# Patient Record
Sex: Female | Born: 1970 | Race: White | Hispanic: No | Smoking: Current every day smoker
Health system: Southern US, Community
[De-identification: ages and names within clinical notes are randomized; demographics above are authoritative.]

## PROBLEM LIST (undated history)

## (undated) DIAGNOSIS — K449 Diaphragmatic hernia without obstruction or gangrene: Secondary | ICD-10-CM

## (undated) DIAGNOSIS — K5790 Diverticulosis of intestine, part unspecified, without perforation or abscess without bleeding: Secondary | ICD-10-CM

## (undated) DIAGNOSIS — E079 Disorder of thyroid, unspecified: Secondary | ICD-10-CM

## (undated) DIAGNOSIS — K219 Gastro-esophageal reflux disease without esophagitis: Secondary | ICD-10-CM

## (undated) DIAGNOSIS — M1990 Unspecified osteoarthritis, unspecified site: Secondary | ICD-10-CM

## (undated) DIAGNOSIS — K529 Noninfective gastroenteritis and colitis, unspecified: Secondary | ICD-10-CM

## (undated) DIAGNOSIS — F32A Depression, unspecified: Secondary | ICD-10-CM

## (undated) DIAGNOSIS — F419 Anxiety disorder, unspecified: Secondary | ICD-10-CM

## (undated) DIAGNOSIS — J45909 Unspecified asthma, uncomplicated: Secondary | ICD-10-CM

## (undated) DIAGNOSIS — G5601 Carpal tunnel syndrome, right upper limb: Secondary | ICD-10-CM

## (undated) DIAGNOSIS — I10 Essential (primary) hypertension: Secondary | ICD-10-CM

## (undated) DIAGNOSIS — T7840XA Allergy, unspecified, initial encounter: Secondary | ICD-10-CM

## (undated) DIAGNOSIS — F329 Major depressive disorder, single episode, unspecified: Secondary | ICD-10-CM

## (undated) DIAGNOSIS — D649 Anemia, unspecified: Secondary | ICD-10-CM

## (undated) HISTORY — DX: Major depressive disorder, single episode, unspecified: F32.9

## (undated) HISTORY — DX: Allergy, unspecified, initial encounter: T78.40XA

## (undated) HISTORY — DX: Disorder of thyroid, unspecified: E07.9

## (undated) HISTORY — DX: Unspecified asthma, uncomplicated: J45.909

## (undated) HISTORY — DX: Anxiety disorder, unspecified: F41.9

## (undated) HISTORY — DX: Depression, unspecified: F32.A

## (undated) HISTORY — DX: Essential (primary) hypertension: I10

---

## 1992-02-20 HISTORY — PX: WISDOM TOOTH EXTRACTION: SHX21

## 2000-06-25 ENCOUNTER — Ambulatory Visit (HOSPITAL_COMMUNITY): Admission: AD | Admit: 2000-06-25 | Discharge: 2000-06-25 | Payer: Self-pay | Admitting: *Deleted

## 2000-06-28 ENCOUNTER — Ambulatory Visit (HOSPITAL_COMMUNITY): Admission: AD | Admit: 2000-06-28 | Discharge: 2000-06-28 | Payer: Self-pay | Admitting: *Deleted

## 2000-07-10 ENCOUNTER — Inpatient Hospital Stay (HOSPITAL_COMMUNITY): Admission: AD | Admit: 2000-07-10 | Discharge: 2000-07-12 | Payer: Self-pay | Admitting: *Deleted

## 2002-01-01 ENCOUNTER — Emergency Department (HOSPITAL_COMMUNITY): Admission: EM | Admit: 2002-01-01 | Discharge: 2002-01-01 | Payer: Self-pay | Admitting: Emergency Medicine

## 2002-01-01 ENCOUNTER — Encounter: Payer: Self-pay | Admitting: Emergency Medicine

## 2002-06-25 ENCOUNTER — Ambulatory Visit (HOSPITAL_COMMUNITY): Admission: RE | Admit: 2002-06-25 | Discharge: 2002-06-25 | Payer: Self-pay | Admitting: Unknown Physician Specialty

## 2005-06-06 ENCOUNTER — Ambulatory Visit: Payer: Self-pay | Admitting: Internal Medicine

## 2005-06-27 ENCOUNTER — Ambulatory Visit: Payer: Self-pay | Admitting: Internal Medicine

## 2008-04-21 ENCOUNTER — Emergency Department (HOSPITAL_COMMUNITY): Admission: EM | Admit: 2008-04-21 | Discharge: 2008-04-21 | Payer: Self-pay | Admitting: Emergency Medicine

## 2008-04-27 ENCOUNTER — Other Ambulatory Visit: Admission: RE | Admit: 2008-04-27 | Discharge: 2008-04-27 | Payer: Self-pay | Admitting: Obstetrics and Gynecology

## 2008-04-29 ENCOUNTER — Ambulatory Visit (HOSPITAL_COMMUNITY): Admission: RE | Admit: 2008-04-29 | Discharge: 2008-04-29 | Payer: Self-pay | Admitting: Obstetrics & Gynecology

## 2010-06-01 LAB — CBC
HCT: 41.7 % (ref 36.0–46.0)
Hemoglobin: 14.1 g/dL (ref 12.0–15.0)
MCHC: 33.9 g/dL (ref 30.0–36.0)
MCV: 88.9 fL (ref 78.0–100.0)
Platelets: 343 10*3/uL (ref 150–400)
RBC: 4.69 MIL/uL (ref 3.87–5.11)
RDW: 13.8 % (ref 11.5–15.5)
WBC: 12.9 10*3/uL — ABNORMAL HIGH (ref 4.0–10.5)

## 2010-06-01 LAB — URINALYSIS, ROUTINE W REFLEX MICROSCOPIC
Protein, ur: NEGATIVE mg/dL
Urobilinogen, UA: 0.2 mg/dL (ref 0.0–1.0)

## 2010-06-01 LAB — COMPREHENSIVE METABOLIC PANEL
Albumin: 4.3 g/dL (ref 3.5–5.2)
Alkaline Phosphatase: 64 U/L (ref 39–117)
BUN: 15 mg/dL (ref 6–23)
Chloride: 103 mEq/L (ref 96–112)
Glucose, Bld: 114 mg/dL — ABNORMAL HIGH (ref 70–99)
Potassium: 4.2 mEq/L (ref 3.5–5.1)
Total Bilirubin: 0.8 mg/dL (ref 0.3–1.2)

## 2010-06-01 LAB — DIFFERENTIAL
Basophils Absolute: 0.1 10*3/uL (ref 0.0–0.1)
Basophils Relative: 1 % (ref 0–1)
Monocytes Absolute: 1.2 10*3/uL — ABNORMAL HIGH (ref 0.1–1.0)
Neutro Abs: 7.7 10*3/uL (ref 1.7–7.7)
Neutrophils Relative %: 60 % (ref 43–77)

## 2010-06-01 LAB — URINE MICROSCOPIC-ADD ON

## 2010-08-06 ENCOUNTER — Emergency Department (HOSPITAL_COMMUNITY)
Admission: EM | Admit: 2010-08-06 | Discharge: 2010-08-06 | Disposition: A | Discharge status: Home / Self Care | Payer: 59 | Attending: Emergency Medicine | Admitting: Emergency Medicine

## 2010-08-06 ENCOUNTER — Emergency Department (HOSPITAL_COMMUNITY): Payer: 59

## 2010-08-06 DIAGNOSIS — R5381 Other malaise: Secondary | ICD-10-CM | POA: Insufficient documentation

## 2010-08-06 DIAGNOSIS — R42 Dizziness and giddiness: Secondary | ICD-10-CM | POA: Insufficient documentation

## 2010-08-06 DIAGNOSIS — R51 Headache: Secondary | ICD-10-CM | POA: Insufficient documentation

## 2010-08-06 DIAGNOSIS — J189 Pneumonia, unspecified organism: Secondary | ICD-10-CM | POA: Insufficient documentation

## 2010-08-06 DIAGNOSIS — Z79899 Other long term (current) drug therapy: Secondary | ICD-10-CM | POA: Insufficient documentation

## 2010-08-06 LAB — DIFFERENTIAL
Eosinophils Relative: 0 % (ref 0–5)
Lymphocytes Relative: 20 % (ref 12–46)
Lymphs Abs: 2.7 10*3/uL (ref 0.7–4.0)
Neutro Abs: 9 10*3/uL — ABNORMAL HIGH (ref 1.7–7.7)
Neutrophils Relative %: 66 % (ref 43–77)

## 2010-08-06 LAB — BASIC METABOLIC PANEL
BUN: 6 mg/dL (ref 6–23)
CO2: 23 mEq/L (ref 19–32)
Chloride: 101 mEq/L (ref 96–112)
Creatinine, Ser: 0.82 mg/dL (ref 0.50–1.10)
Glucose, Bld: 106 mg/dL — ABNORMAL HIGH (ref 70–99)

## 2010-08-06 LAB — CBC
HCT: 44.1 % (ref 36.0–46.0)
Hemoglobin: 14.9 g/dL (ref 12.0–15.0)
MCV: 88.4 fL (ref 78.0–100.0)
RBC: 4.99 MIL/uL (ref 3.87–5.11)
WBC: 13.8 10*3/uL — ABNORMAL HIGH (ref 4.0–10.5)

## 2010-08-06 LAB — RAPID STREP SCREEN (MED CTR MEBANE ONLY): Streptococcus, Group A Screen (Direct): NEGATIVE

## 2011-11-24 ENCOUNTER — Emergency Department (HOSPITAL_COMMUNITY)
Admission: EM | Admit: 2011-11-24 | Discharge: 2011-11-24 | Disposition: A | Discharge status: Home / Self Care | Payer: 59 | Attending: Emergency Medicine | Admitting: Emergency Medicine

## 2011-11-24 ENCOUNTER — Encounter (HOSPITAL_COMMUNITY): Payer: Self-pay | Admitting: *Deleted

## 2011-11-24 DIAGNOSIS — W278XXA Contact with other nonpowered hand tool, initial encounter: Secondary | ICD-10-CM | POA: Insufficient documentation

## 2011-11-24 DIAGNOSIS — S61409A Unspecified open wound of unspecified hand, initial encounter: Secondary | ICD-10-CM | POA: Insufficient documentation

## 2011-11-24 DIAGNOSIS — S61419A Laceration without foreign body of unspecified hand, initial encounter: Secondary | ICD-10-CM

## 2011-11-24 DIAGNOSIS — Z23 Encounter for immunization: Secondary | ICD-10-CM | POA: Insufficient documentation

## 2011-11-24 HISTORY — DX: Unspecified osteoarthritis, unspecified site: M19.90

## 2011-11-24 MED ORDER — TETANUS-DIPHTH-ACELL PERTUSSIS 5-2.5-18.5 LF-MCG/0.5 IM SUSP
INTRAMUSCULAR | Status: AC
  Administered 2011-11-24: 0.5 mL via INTRAMUSCULAR
  Filled 2011-11-24: qty 0.5

## 2011-11-24 MED ORDER — TETANUS-DIPHTH-ACELL PERTUSSIS 5-2.5-18.5 LF-MCG/0.5 IM SUSP
0.5000 mL | Freq: Once | INTRAMUSCULAR | Status: AC
  Administered 2011-11-24: 0.5 mL via INTRAMUSCULAR

## 2011-11-24 MED ORDER — LIDOCAINE HCL (PF) 1 % IJ SOLN
INTRAMUSCULAR | Status: AC
  Administered 2011-11-24: 22:00:00
  Filled 2011-11-24: qty 5

## 2011-11-24 NOTE — ED Notes (Signed)
Pt presents with left palm laceration obtained on a box cutter while at home. Pt states palm of hand is tingling and is throbbing. Bleeding controlled. Wound cleaned. Grasps equal bilaterally. NAD noted.Last tetanus unknown, EDP aware.

## 2011-11-24 NOTE — ED Provider Notes (Signed)
History     CSN: [id number]  Arrival date & time 11/24/11  2120   First MD Initiated Contact with Patient 11/24/11 2150      Chief Complaint  Patient presents with  . Laceration    (Consider location/radiation/quality/duration/timing/severity/associated sxs/prior treatment) HPI Comments: Using a utility knife to cut a box.  Slipped and "stabbed myself" in the L hand.  No other injuries or complaints.  Patient is a 41 y.o. female presenting with skin laceration. The history is provided by the patient. No language interpreter was used.  Laceration  The incident occurred 1 to 2 hours ago. The laceration is located on the left hand. The laceration is 2 cm in size. The laceration mechanism was a a razor. The pain is mild. The pain has been constant since onset. She reports no foreign bodies present. Her tetanus status is out of date.    Past Medical History  Diagnosis Date  . Arthritis     History reviewed. No pertinent past surgical history.  No family history on file.  History  Substance Use Topics  . Smoking status: Current Every Day Smoker  . Smokeless tobacco: Not on file  . Alcohol Use: Yes     occ    OB History    Grav Para Term Preterm Abortions TAB SAB Ect Mult Living                  Review of Systems  Constitutional: Negative for fever and chills.  Skin: Positive for wound.  Neurological:       Tingling   All other systems reviewed and are negative.    Allergies  Sulfa antibiotics  Home Medications  No current outpatient prescriptions on file.  BP 179/98  Pulse 77  Temp 98.6 F (37 C) (Oral)  Resp 18  Ht 5\' 5"  (1.651 m)  Wt 155 lb (70.308 kg)  BMI 25.79 kg/m2  SpO2 100%  Physical Exam  Nursing note and vitals reviewed. Constitutional: She is oriented to person, place, and time. She appears well-developed and well-nourished. No distress.  HENT:  Head: Normocephalic and atraumatic.  Eyes: EOM are normal.  Neck: Normal range of motion.    Cardiovascular: Normal rate, regular rhythm and normal heart sounds.   Pulmonary/Chest: Effort normal and breath sounds normal.  Abdominal: Soft. She exhibits no distension. There is no tenderness.  Musculoskeletal: She exhibits tenderness.       Left hand: She exhibits decreased range of motion, tenderness and laceration. She exhibits no bony tenderness, normal two-point discrimination, normal capillary refill, no deformity and no swelling. normal sensation noted. Normal strength noted.       Hands: Neurological: She is alert and oriented to person, place, and time.  Skin: Skin is warm and dry.  Psychiatric: She has a normal mood and affect. Judgment normal.    ED Course  LACERATION REPAIR Date/Time: 11/24/2011 10:10 PM Performed by: Evalina Field Authorized by: Evalina Field Consent: Verbal consent obtained. Written consent not obtained. Risks and benefits: risks, benefits and alternatives were discussed Consent given by: patient Patient understanding: patient states understanding of the procedure being performed Patient consent: the patient's understanding of the procedure matches consent given Site marked: the operative site was not marked Imaging studies: imaging studies not available Patient identity confirmed: verbally with patient Time out: Immediately prior to procedure a "time out" was called to verify the correct patient, procedure, equipment, support staff and site/side marked as required. Body area: upper extremity Location details: left  hand Laceration length: 2 cm Foreign bodies: no foreign bodies Tendon involvement: none Nerve involvement: none Vascular damage: no Anesthesia: local infiltration Local anesthetic: lidocaine 1% without epinephrine Anesthetic total: 3 ml Patient sedated: no Preparation: Patient was prepped and draped in the usual sterile fashion. Irrigation solution: saline Irrigation method: syringe Amount of cleaning: standard Debridement:  none Skin closure: 4-0 nylon Technique: simple Approximation: close Approximation difficulty: simple Dressing: antibiotic ointment and 4x4 sterile gauze Patient tolerance: Patient tolerated the procedure well with no immediate complications.   (including critical care time)  Labs Reviewed - No data to display No results found.   1. Hand laceration       MDM  Wash BID Suture removal in 8-10 days.        Evalina Field, [place] 11/24/11 2228

## 2011-11-24 NOTE — ED Notes (Signed)
Pt cut left palm with a box cutter, tingling in left hand and arm

## 2011-11-25 NOTE — ED Provider Notes (Signed)
Medical screening examination/treatment/procedure(s) were performed by non-physician practitioner and as supervising physician I was immediately available for consultation/collaboration.   TRUE Garciamartinez L Stirling Orton, MD 11/25/11 0748 

## 2011-12-12 ENCOUNTER — Emergency Department (HOSPITAL_COMMUNITY): Payer: 59

## 2011-12-12 ENCOUNTER — Emergency Department (HOSPITAL_COMMUNITY)
Admission: EM | Admit: 2011-12-12 | Discharge: 2011-12-12 | Disposition: A | Discharge status: Home / Self Care | Payer: 59 | Attending: Emergency Medicine | Admitting: Emergency Medicine

## 2011-12-12 ENCOUNTER — Encounter (HOSPITAL_COMMUNITY): Payer: Self-pay | Admitting: *Deleted

## 2011-12-12 DIAGNOSIS — F17200 Nicotine dependence, unspecified, uncomplicated: Secondary | ICD-10-CM | POA: Insufficient documentation

## 2011-12-12 DIAGNOSIS — Z79899 Other long term (current) drug therapy: Secondary | ICD-10-CM | POA: Insufficient documentation

## 2011-12-12 DIAGNOSIS — R197 Diarrhea, unspecified: Secondary | ICD-10-CM | POA: Insufficient documentation

## 2011-12-12 DIAGNOSIS — Z8739 Personal history of other diseases of the musculoskeletal system and connective tissue: Secondary | ICD-10-CM | POA: Insufficient documentation

## 2011-12-12 DIAGNOSIS — K5289 Other specified noninfective gastroenteritis and colitis: Secondary | ICD-10-CM | POA: Insufficient documentation

## 2011-12-12 DIAGNOSIS — K529 Noninfective gastroenteritis and colitis, unspecified: Secondary | ICD-10-CM

## 2011-12-12 DIAGNOSIS — R112 Nausea with vomiting, unspecified: Secondary | ICD-10-CM | POA: Insufficient documentation

## 2011-12-12 LAB — COMPREHENSIVE METABOLIC PANEL
AST: 25 U/L (ref 0–37)
BUN: 8 mg/dL (ref 6–23)
CO2: 22 mEq/L (ref 19–32)
Calcium: 10 mg/dL (ref 8.4–10.5)
Creatinine, Ser: 0.79 mg/dL (ref 0.50–1.10)
GFR calc non Af Amer: >90 mL/min (ref >90)
Total Bilirubin: 0.3 mg/dL (ref 0.3–1.2)

## 2011-12-12 LAB — CBC WITH DIFFERENTIAL/PLATELET
Basophils Absolute: 0 10*3/uL (ref 0.0–0.1)
Basophils Relative: 0 % (ref 0–1)
Eosinophils Relative: 0 % (ref 0–5)
HCT: 45.1 % (ref 36.0–46.0)
Hemoglobin: 15.5 g/dL — ABNORMAL HIGH (ref 12.0–15.0)
Lymphocytes Relative: 11 % — ABNORMAL LOW (ref 12–46)
MCHC: 34.4 g/dL (ref 30.0–36.0)
MCV: 87.9 fL (ref 78.0–100.0)
Monocytes Absolute: 1.1 10*3/uL — ABNORMAL HIGH (ref 0.1–1.0)
Monocytes Relative: 6 % (ref 3–12)
RDW: 13.4 % (ref 11.5–15.5)

## 2011-12-12 LAB — URINALYSIS, ROUTINE W REFLEX MICROSCOPIC
Bilirubin Urine: NEGATIVE
Ketones, ur: NEGATIVE mg/dL
Leukocytes, UA: NEGATIVE
Nitrite: NEGATIVE
Protein, ur: NEGATIVE mg/dL
Urobilinogen, UA: 0.2 mg/dL (ref 0.0–1.0)
pH: 8.5 — ABNORMAL HIGH (ref 5.0–8.0)

## 2011-12-12 LAB — LIPASE, BLOOD: Lipase: 30 U/L (ref 11–59)

## 2011-12-12 LAB — PREGNANCY, URINE: Preg Test, Ur: NEGATIVE

## 2011-12-12 MED ORDER — SODIUM CHLORIDE 0.9 % IV SOLN
INTRAVENOUS | Status: DC
  Administered 2011-12-12: 12:00:00 via INTRAVENOUS

## 2011-12-12 MED ORDER — METRONIDAZOLE 500 MG PO TABS: 500.0000 mg | ORAL_TABLET | Freq: Two times a day (BID) | ORAL | Status: DC

## 2011-12-12 MED ORDER — METRONIDAZOLE 500 MG PO TABS
500.0000 mg | ORAL_TABLET | Freq: Once | ORAL | Status: AC
  Administered 2011-12-12: 500 mg via ORAL
  Filled 2011-12-12: qty 1

## 2011-12-12 MED ORDER — CIPROFLOXACIN HCL 250 MG PO TABS
500.0000 mg | ORAL_TABLET | Freq: Once | ORAL | Status: AC
  Administered 2011-12-12: 500 mg via ORAL
  Filled 2011-12-12: qty 2

## 2011-12-12 MED ORDER — ONDANSETRON 4 MG PO TBDP: 4.0000 mg | ORAL_TABLET | Freq: Three times a day (TID) | ORAL | Status: DC | PRN

## 2011-12-12 MED ORDER — IOHEXOL 300 MG/ML  SOLN
100.0000 mL | Freq: Once | INTRAMUSCULAR | Status: AC | PRN
  Administered 2011-12-12: 100 mL via INTRAVENOUS

## 2011-12-12 MED ORDER — OXYCODONE-ACETAMINOPHEN 5-325 MG PO TABS: ORAL_TABLET | ORAL | Status: DC

## 2011-12-12 MED ORDER — MORPHINE SULFATE 4 MG/ML IJ SOLN
4.0000 mg | INTRAMUSCULAR | Status: AC | PRN
  Administered 2011-12-12 (×2): 4 mg via INTRAVENOUS
  Filled 2011-12-12 (×2): qty 1

## 2011-12-12 MED ORDER — ONDANSETRON HCL 4 MG/2ML IJ SOLN
4.0000 mg | INTRAMUSCULAR | Status: DC | PRN
  Administered 2011-12-12: 4 mg via INTRAVENOUS
  Filled 2011-12-12: qty 2

## 2011-12-12 MED ORDER — CIPROFLOXACIN HCL 500 MG PO TABS: 500.0000 mg | ORAL_TABLET | Freq: Two times a day (BID) | ORAL | Status: DC

## 2011-12-12 MED ORDER — FAMOTIDINE IN NACL 20-0.9 MG/50ML-% IV SOLN
20.0000 mg | Freq: Once | INTRAVENOUS | Status: AC
  Administered 2011-12-12: 20 mg via INTRAVENOUS
  Filled 2011-12-12: qty 50

## 2011-12-12 NOTE — ED Notes (Signed)
NVD onset 7am, with abd pain

## 2011-12-12 NOTE — ED Provider Notes (Signed)
History     CSN: [id number]  Arrival date & time 12/12/11  1055   First MD Initiated Contact with Patient 12/12/11 1112      Chief Complaint  Patient presents with  . Emesis     HPI Pt was seen at 1130.  Per pt, c/o gradual onset and persistence of constant abd "pain" that began approx 0700 this morning PTA.  Describes the pain as starting in her upper abd, now diffuse; and radiates into her mid-back.  Has been associated with multiple intermittent episodes of N/V/D.  States she "saw blood on the toilet paper" when she wiped after having a BM.  Describes the stool as "watery."  Describes the emesis as "green."  States she had a similar episode of these symptoms several weeks ago, but it resolved after approximately 1-2 hours.  Denies pain worsens after eating certain foods.  Denies CP/SOB, no cough, no flank pain, no black or blood in stools or emesis, no fevers, no rash, no recent travel.      Past Medical History  Diagnosis Date  . Arthritis     History reviewed. No pertinent past surgical history.  History  Substance Use Topics  . Smoking status: Current Every Day Smoker  . Smokeless tobacco: Not on file  . Alcohol Use: Yes     occ      Review of Systems ROS: Statement: All systems negative except as marked or noted in the HPI; Constitutional: Negative for fever and chills. ; ; Eyes: Negative for eye pain, redness and discharge. ; ; ENMT: Negative for ear pain, hoarseness, nasal congestion, sinus pressure and sore throat. ; ; Cardiovascular: Negative for chest pain, palpitations, diaphoresis, dyspnea and peripheral edema. ; ; Respiratory: Negative for cough, wheezing and stridor. ; ; Gastrointestinal: +N/V/D, abd pain, blood on toilet paper when wiped after BM.  Negative for blood in stool, hematemesis, jaundice. . ; ; Genitourinary: Negative for dysuria, flank pain and hematuria. ; ; Musculoskeletal: Negative for back pain and neck pain. Negative for swelling and trauma.; ;  Skin: Negative for pruritus, rash, abrasions, blisters, bruising and skin lesion.; ; Neuro: Negative for headache, lightheadedness and neck stiffness. Negative for weakness, altered level of consciousness , altered mental status, extremity weakness, paresthesias, involuntary movement, seizure and syncope.       Allergies  Adhesive and Sulfa antibiotics  Home Medications   Current Outpatient Rx  Name Route Sig Dispense Refill  . MELATONIN 3 MG PO TABS Oral Take 3 mg by mouth at bedtime.    . OXYCODONE-ACETAMINOPHEN 5-325 MG PO TABS Oral Take 1 tablet by mouth every 4 (four) hours as needed. Pain      BP 138/91  Pulse 81  Temp 98.7 F (37.1 C) (Oral)  Resp 18  Ht 5\' 6"  (1.676 m)  Wt 155 lb (70.308 kg)  BMI 25.02 kg/m2  SpO2 100%  Physical Exam 1135: Physical examination:  Nursing notes reviewed; Vital signs and O2 SAT reviewed;  Constitutional: Well developed, Well nourished, Well hydrated, Uncomfortable appearing; Head:  Normocephalic, atraumatic; Eyes: EOMI, PERRL, No scleral icterus; ENMT: Mouth and pharynx normal, Mucous membranes moist; Neck: Supple, Full range of motion, No lymphadenopathy; Cardiovascular: Regular rate and rhythm, No gallop; Respiratory: Breath sounds clear & equal bilaterally, No rales, rhonchi, wheezes.  Speaking full sentences with ease, Normal respiratory effort/excursion; Chest: Nontender, Movement normal; Abdomen: Soft, +diffusely tender to palp, esp RUQ, mid-epigastric, LUQ. No rebound. Nondistended, Normal bowel sounds;; Extremities: Pulses normal, No tenderness, No  edema, No calf edema or asymmetry.; Neuro: AA&Ox3, Major CN grossly intact.  Speech clear. No gross focal motor or sensory deficits in extremities.; Skin: Color normal, Warm, Dry.   ED Course  Procedures  MDM  MDM Reviewed: nursing note and vitals Interpretation: labs, x-ray and CT scan     Results for orders placed during the hospital encounter of 12/12/11  CBC WITH DIFFERENTIAL       Component Value Range   WBC 17.4 (*) 4.0 - 10.5 K/uL   RBC 5.13 (*) 3.87 - 5.11 MIL/uL   Hemoglobin 15.5 (*) 12.0 - 15.0 g/dL   HCT 16.1  09.6 - 04.5 %   MCV 87.9  78.0 - 100.0 fL   MCH 30.2  26.0 - 34.0 pg   MCHC 34.4  30.0 - 36.0 g/dL   RDW 40.9  81.1 - 91.4 %   Platelets 368  150 - 400 K/uL   Neutrophils Relative 83 (*) 43 - 77 %   Neutro Abs 14.4 (*) 1.7 - 7.7 K/uL   Lymphocytes Relative 11 (*) 12 - 46 %   Lymphs Abs 1.8  0.7 - 4.0 K/uL   Monocytes Relative 6  3 - 12 %   Monocytes Absolute 1.1 (*) 0.1 - 1.0 K/uL   Eosinophils Relative 0  0 - 5 %   Eosinophils Absolute 0.0  0.0 - 0.7 K/uL   Basophils Relative 0  0 - 1 %   Basophils Absolute 0.0  0.0 - 0.1 K/uL  COMPREHENSIVE METABOLIC PANEL      Component Value Range   Sodium 140  135 - 145 mEq/L   Potassium 3.7  3.5 - 5.1 mEq/L   Chloride 104  96 - 112 mEq/L   CO2 22  19 - 32 mEq/L   Glucose, Bld 127 (*) 70 - 99 mg/dL   BUN 8  6 - 23 mg/dL   Creatinine, Ser 7.82  0.50 - 1.10 mg/dL   Calcium 95.6  8.4 - 21.3 mg/dL   Total Protein 7.6  6.0 - 8.3 g/dL   Albumin 4.3  3.5 - 5.2 g/dL   AST 25  0 - 37 U/L   ALT 27  0 - 35 U/L   Alkaline Phosphatase 102  39 - 117 U/L   Total Bilirubin 0.3  0.3 - 1.2 mg/dL   GFR calc non Af Amer >90  >90 mL/min   GFR calc Af Amer >90  >90 mL/min  LIPASE, BLOOD      Component Value Range   Lipase 30  11 - 59 U/L  PREGNANCY, URINE      Component Value Range   Preg Test, Ur NEGATIVE  NEGATIVE  URINALYSIS, ROUTINE W REFLEX MICROSCOPIC      Component Value Range   Color, Urine YELLOW  YELLOW   APPearance CLEAR  CLEAR   Specific Gravity, Urine 1.010  1.005 - 1.030   pH 8.5 (*) 5.0 - 8.0   Glucose, UA NEGATIVE  NEGATIVE mg/dL   Hgb urine dipstick NEGATIVE  NEGATIVE   Bilirubin Urine NEGATIVE  NEGATIVE   Ketones, ur NEGATIVE  NEGATIVE mg/dL   Protein, ur NEGATIVE  NEGATIVE mg/dL   Urobilinogen, UA 0.2  0.0 - 1.0 mg/dL   Nitrite NEGATIVE  NEGATIVE   Leukocytes, UA NEGATIVE  NEGATIVE   Dg  Chest 2 View 12/12/2011  *RADIOLOGY REPORT*  Clinical Data: Vomiting and diarrhea  CHEST - 2 VIEW  Comparison: At 08/06/2010  Findings: Heart size and vascular  pattern are normal.  The lungs are clear.  IMPRESSION: Negative   Original Report Authenticated By: Otilio Carpen, M.D.    Ct Abdomen Pelvis W Contrast 12/12/2011  *RADIOLOGY REPORT*  Clinical Data: Abdomen pain, nausea and vomiting  CT ABDOMEN AND PELVIS WITH CONTRAST  Technique:  Multidetector CT imaging of the abdomen and pelvis was performed following the standard protocol during bolus administration of intravenous contrast.  Contrast: OMNIPAQUE IOHEXOL 300 MG/ML  SOLN  Comparison: None.  Findings: Mild bilateral lower lobe atelectasis.  No acute musculoskeletal abnormalities.  Liver, gallbladder, spleen, pancreas, kidneys, and adrenal glands are normal.  Bowel gas pattern shows no evidence of obstruction. Aorta is not dilated.  Appendix is normal.  Bladder and reproductive organs are normal.  There is no free fluid.  There is a tiny calcific density adjacent to the distal left ureter.  It is not possible to spatially separate this from the ureter, but it appears likely just posterior to the ureter and furthermore there is no hydronephrosis.  These findings suggest that this is a phlebolith.  There is mild wall thickening involving the distal half of the ascending colon and the proximal half of the transverse colon.  There is also mild wall thickening involving the mid sigmoid colon.  IMPRESSION: Discontinuous areas of colon wall thickening.  This suggests the presence of colitis, which could be due to infectious causes. Inflammatory bowel disease is another consideration.   Original Report Authenticated By: Otilio Carpen, M.D.      [id number]:  Pt states she feels "better now" and wants to go home.  Offered admission. Pt declined, stating "I can feel like this at home, I don't want to be admitted."  Pt has tol PO well while in the ED.  No  vomiting or stooling while in the ED.  Remains afebrile with stable VS.  1st doses of abx given.  Dx and testing d/w pt and family.  Questions answered.  Verb understanding, agreeable to d/c home with outpt f/u.  Strict return precautions given.        Laray Anger, DO 12/14/11 1907

## 2011-12-12 NOTE — ED Notes (Signed)
Debra Barnett, in lab, reports urine pregnancy is negative.

## 2011-12-12 NOTE — ED Notes (Signed)
Pt sipping on ginger ale, tolerating well at present time,

## 2011-12-12 NOTE — ED Notes (Signed)
Pt finished drinking ct contrast, ct notified and advised pt still pending pregnancy test results, advised ct that we would call with results when they are available

## 2011-12-12 NOTE — ED Notes (Signed)
Pt c/o abd pain, n/v/d, chills, that started this am, pt states that she noticed bright red blood in her stool this am when she had the diarrhea, pt also states that she had an episode similar to this last week but today she is worse,  and she noticed bright red blood in her diarrhea then as well,

## 2011-12-21 DIAGNOSIS — K529 Noninfective gastroenteritis and colitis, unspecified: Secondary | ICD-10-CM

## 2011-12-21 HISTORY — DX: Noninfective gastroenteritis and colitis, unspecified: K52.9

## 2012-01-23 ENCOUNTER — Ambulatory Visit: Payer: 59 | Admitting: Gastroenterology

## 2012-01-27 ENCOUNTER — Emergency Department (HOSPITAL_COMMUNITY)
Admission: EM | Admit: 2012-01-27 | Discharge: 2012-01-27 | Disposition: A | Discharge status: Home / Self Care | Payer: 59 | Attending: Emergency Medicine | Admitting: Emergency Medicine

## 2012-01-27 ENCOUNTER — Emergency Department (HOSPITAL_COMMUNITY): Payer: 59

## 2012-01-27 ENCOUNTER — Encounter (HOSPITAL_COMMUNITY): Payer: Self-pay | Admitting: *Deleted

## 2012-01-27 DIAGNOSIS — R51 Headache: Secondary | ICD-10-CM | POA: Insufficient documentation

## 2012-01-27 DIAGNOSIS — F17200 Nicotine dependence, unspecified, uncomplicated: Secondary | ICD-10-CM | POA: Insufficient documentation

## 2012-01-27 DIAGNOSIS — R52 Pain, unspecified: Secondary | ICD-10-CM | POA: Insufficient documentation

## 2012-01-27 DIAGNOSIS — B349 Viral infection, unspecified: Secondary | ICD-10-CM

## 2012-01-27 DIAGNOSIS — J029 Acute pharyngitis, unspecified: Secondary | ICD-10-CM | POA: Insufficient documentation

## 2012-01-27 DIAGNOSIS — R509 Fever, unspecified: Secondary | ICD-10-CM | POA: Insufficient documentation

## 2012-01-27 DIAGNOSIS — Z8739 Personal history of other diseases of the musculoskeletal system and connective tissue: Secondary | ICD-10-CM | POA: Insufficient documentation

## 2012-01-27 DIAGNOSIS — B9789 Other viral agents as the cause of diseases classified elsewhere: Secondary | ICD-10-CM | POA: Insufficient documentation

## 2012-01-27 DIAGNOSIS — Z8719 Personal history of other diseases of the digestive system: Secondary | ICD-10-CM | POA: Insufficient documentation

## 2012-01-27 HISTORY — DX: Noninfective gastroenteritis and colitis, unspecified: K52.9

## 2012-01-27 MED ORDER — IBUPROFEN 800 MG PO TABS
800.0000 mg | ORAL_TABLET | Freq: Once | ORAL | Status: AC
  Administered 2012-01-27: 800 mg via ORAL
  Filled 2012-01-27: qty 1

## 2012-01-27 MED ORDER — HYDROCODONE-ACETAMINOPHEN 5-325 MG PO TABS
ORAL_TABLET | ORAL | Status: AC
  Administered 2012-01-27: 1 via ORAL
  Filled 2012-01-27: qty 1

## 2012-01-27 MED ORDER — GUAIFENESIN-CODEINE 100-10 MG/5ML PO SYRP: ORAL_SOLUTION | ORAL | Status: DC

## 2012-01-27 MED ORDER — HYDROCOD POLST-CHLORPHEN POLST 10-8 MG/5ML PO LQCR
5.0000 mL | Freq: Once | ORAL | Status: AC
  Administered 2012-01-27: 5 mL via ORAL
  Filled 2012-01-27: qty 5

## 2012-01-27 MED ORDER — HYDROCODONE-ACETAMINOPHEN 5-325 MG PO TABS
1.0000 | ORAL_TABLET | Freq: Once | ORAL | Status: AC
  Administered 2012-01-27: 1 via ORAL

## 2012-01-27 NOTE — ED Notes (Signed)
Patient with no complaints at this time. Respirations even and unlabored. Skin warm/dry. Discharge instructions reviewed with patient at this time. Patient given opportunity to voice concerns/ask questions. Patient discharged at this time and left Emergency Department with steady gait.   

## 2012-01-27 NOTE — ED Provider Notes (Signed)
Medical screening examination/treatment/procedure(s) were performed by non-physician practitioner and as supervising physician I was immediately available for consultation/collaboration.   Nikitta Sobiech III, MD 01/27/12 1410 

## 2012-01-27 NOTE — ED Notes (Signed)
Reports generalized body aches and cough since yesterday.  Unsure of fever.

## 2012-01-27 NOTE — ED Provider Notes (Signed)
History     CSN: [id number]  Arrival date & time 01/27/12  1134   First MD Initiated Contact with Patient 01/27/12 1300      Chief Complaint  Patient presents with  . Generalized Body Aches  . Cough    (Consider location/radiation/quality/duration/timing/severity/associated sxs/prior treatment) Patient is a 41 y.o. female presenting with cough. The history is provided by the patient. No language interpreter was used.  Cough The current episode started 2 days ago. The cough is non-productive. Associated symptoms include headaches, sore throat and myalgias. Pertinent negatives include no chills and no ear pain. Associated symptoms comments: Headache and myalgias.  Temps > 102 . Treatments tried: nyquil. The treatment provided no relief. She is not a smoker. Her past medical history does not include bronchitis, pneumonia, COPD, emphysema or asthma.    Past Medical History  Diagnosis Date  . Arthritis   . Colitis     History reviewed. No pertinent past surgical history.  No family history on file.  History  Substance Use Topics  . Smoking status: Current Every Day Smoker  . Smokeless tobacco: Not on file  . Alcohol Use: Yes     Comment: occ    OB History    Grav Para Term Preterm Abortions TAB SAB Ect Mult Living                  Review of Systems  Constitutional: Positive for fever. Negative for chills.  HENT: Positive for sore throat. Negative for ear pain.   Respiratory: Positive for cough.   Musculoskeletal: Positive for myalgias.  Neurological: Positive for headaches.  All other systems reviewed and are negative.    Allergies  Adhesive and Sulfa antibiotics  Home Medications   Current Outpatient Rx  Name  Route  Sig  Dispense  Refill  . CALCIUM CARBONATE ANTACID 500 MG PO CHEW   Oral   Chew 2-3 tablets by mouth daily as needed. For indigestion         . MELATONIN 3 MG PO TABS   Oral   Take 3 mg by mouth at bedtime.         .  GUAIFENESIN-CODEINE 100-10 MG/5ML PO SYRP      10 ml po q 4-6 hrs prn cough   240 mL   0     BP 129/81  Pulse 107  Temp 100.8 F (38.2 C) (Oral)  Resp 18  Ht 5\' 5"  (1.651 m)  Wt 150 lb (68.04 kg)  BMI 24.96 kg/m2  SpO2 99%  Physical Exam  Nursing note and vitals reviewed. Constitutional: She is oriented to person, place, and time. She appears well-developed and well-nourished. She is cooperative.  Non-toxic appearance. She does not have a sickly appearance. She appears ill. No distress.  HENT:  Head: Normocephalic and atraumatic.  Mouth/Throat: Uvula is midline, oropharynx is clear and moist and mucous membranes are normal. No uvula swelling. No oropharyngeal exudate, posterior oropharyngeal edema, posterior oropharyngeal erythema or tonsillar abscesses.  Eyes: EOM are normal.  Neck: Normal range of motion.  Cardiovascular: Normal rate and regular rhythm.   Pulmonary/Chest: Effort normal and breath sounds normal. No accessory muscle usage. Not tachypneic. No respiratory distress. She has no decreased breath sounds. She has no wheezes. She has no rhonchi. She has no rales. She exhibits no tenderness.  Abdominal: Soft. She exhibits no distension. There is no tenderness.  Musculoskeletal: Normal range of motion.  Neurological: She is alert and oriented to person, place, and time.  Skin: Skin is warm and dry.  Psychiatric: She has a normal mood and affect. Judgment normal.    ED Course  Procedures (including critical care time)   Labs Reviewed  RAPID STREP SCREEN  RAPID STREP SCREEN   Dg Chest 2 View  01/27/2012  *RADIOLOGY REPORT*  Clinical Data: Cough, fever  CHEST - 2 VIEW  Comparison: 12/12/2011  Findings: Cardiomediastinal silhouette is stable.  No acute infiltrate or pleural effusion.  No pulmonary edema.  Bony thorax is stable.  IMPRESSION: No active disease.  No significant change.   Original Report Authenticated By: Natasha Mead, M.D.      1. Viral illness        MDM  No PNA Strep screen neg Ibuprofen  rx-robitussin AC, 240 ml F/u with PCP        Evalina Field, PA 01/27/12 1338

## 2012-01-28 ENCOUNTER — Ambulatory Visit: Payer: 59 | Admitting: Gastroenterology

## 2012-03-27 ENCOUNTER — Encounter: Payer: Self-pay | Admitting: Urgent Care

## 2012-03-27 ENCOUNTER — Ambulatory Visit (INDEPENDENT_AMBULATORY_CARE_PROVIDER_SITE_OTHER): Payer: 59 | Admitting: Urgent Care

## 2012-03-27 VITALS — BP 138/90 | HR 82 | Temp 98.1°F | Ht 65.0 in | Wt 145.2 lb

## 2012-03-27 DIAGNOSIS — K5289 Other specified noninfective gastroenteritis and colitis: Secondary | ICD-10-CM

## 2012-03-27 DIAGNOSIS — R110 Nausea: Secondary | ICD-10-CM | POA: Insufficient documentation

## 2012-03-27 DIAGNOSIS — K921 Melena: Secondary | ICD-10-CM

## 2012-03-27 DIAGNOSIS — K219 Gastro-esophageal reflux disease without esophagitis: Secondary | ICD-10-CM | POA: Insufficient documentation

## 2012-03-27 DIAGNOSIS — K529 Noninfective gastroenteritis and colitis, unspecified: Secondary | ICD-10-CM

## 2012-03-27 MED ORDER — PANTOPRAZOLE SODIUM 40 MG PO TBEC: 40.0000 mg | DELAYED_RELEASE_TABLET | Freq: Every day | ORAL | Status: DC

## 2012-03-27 MED ORDER — PEG 3350-KCL-NA BICARB-NACL 420 G PO SOLR: 4000.0000 mL | ORAL | Status: DC

## 2012-03-27 NOTE — Progress Notes (Signed)
Referring Provider: Corrie Mckusick, MD Primary Care Physician:  Colette Ribas, MD Primary Gastroenterologist:  Dr. Jonette Eva  Chief Complaint  Patient presents with  . Abdominal Pain  . Medication Refill    HPI:  Debra Barnett is a 42 y.o. female here as a referral from Dr. Phillips Odor for chronic abdominal pain & nausea that started 4 months ago.  She was in her usual state of health, she ate breakfast for supper, & the next morning she felt like she was "gonna die".  She presented to the ER & had colitis on CT done at [facility] (see below).  She has been having intermittent hematochezia & saw bright red blood in her stool again yesterday.  She has had diarrhea at least 3 times over the past week.  Some days she feels constipated.  She feels like everything she eats makes her sick.  She c/o horrible acid reflux.  She prefers spicy foods, but has been avoiding them due to her symptoms.  C/o anorexia.  She has chronic nausea.  She did have sharp knife-like pain that night in ER.  She has had upper abdominal pain that is sharp & radiates to her back.  Occasionally, she belches or passes flatus & that makes the pain better.  She reports a weight loss of 10# in past 4 months.  She takes TUMS prn which provides little help.  She was previously on Protonix 40mg  in the hospital & that seemed to help.  Denies dysphagia or odynophagia.  She denies fever or chills.  She has used Marijuana a couple times per year & reports her last episode was 2 weeks ago.  She takes occasional BC powders once every 2 weeks.  Oct 2013 CT A/P with IV contrast- Discontinuous areas of colon wall thickening. This suggests the presence of colitis, which could be due to infectious causes. Inflammatory bowel disease is another consideration.  Past Medical History  Diagnosis Date  . Arthritis   . Colitis Nov 2013    No past surgical history on file.  Current Outpatient Prescriptions  Medication Sig Dispense  Refill  . calcium carbonate (TUMS - DOSED IN MG ELEMENTAL CALCIUM) 500 MG chewable tablet Chew 2-3 tablets by mouth daily as needed. For indigestion      . Melatonin 3 MG TABS Take 3 mg by mouth at bedtime.      . pantoprazole (PROTONIX) 40 MG tablet Take 1 tablet (40 mg total) by mouth daily.  30 tablet  2  . polyethylene glycol-electrolytes (TRILYTE) 420 G solution Take 4,000 mLs by mouth as directed.  4000 mL  0    Allergies as of 03/27/2012 - Review Complete 03/27/2012  Allergen Reaction Noted  . Adhesive (tape) Other (See Comments) 12/12/2011  . Sulfa antibiotics Itching 11/24/2011    Family History:There is no known family history of colorectal carcinoma , liver disease, or inflammatory bowel disease.  Problem Relation Age of Onset  . Thyroid disease Mother   . Stroke Father   . Diabetes Father     History   Social History  . Marital Status: Married    Spouse Name: N/A    Number of Children: N/A  . Years of Education: N/A   Occupational History  . Not on file.   Social History Main Topics  . Smoking status: Current Every Day Smoker -- 0.5 packs/day for 22 years    Types: Cigarettes  . Smokeless tobacco: Not on file  . Alcohol  Use: Yes     Comment: occasionally 3 times per year, HX heavy etoh x 2 yrs (20's)  . Drug Use: Yes     Comment: Past marijuana last couple weeks  . Sexually Active: Yes    Birth Control/ Protection: None   Review of Systems: Gen: see HPI CV: Denies chest pain, angina, palpitations, syncope, orthopnea, PND, peripheral edema, and claudication. Resp: Denies dyspnea at rest, dyspnea with exercise, cough, sputum, wheezing, coughing up blood, and pleurisy. GI: Denies vomiting blood, jaundice, and fecal incontinence. GU : Denies urinary burning, blood in urine, urinary frequency, urinary hesitancy, nocturnal urination, and urinary incontinence. MS: Denies joint pain, limitation of movement, and swelling, stiffness, low back pain, extremity pain.  Denies muscle weakness, cramps, atrophy.  Derm: Denies rash, itching, dry skin, hives, moles, warts, or unhealing ulcers.  Psych: Denies depression, anxiety, memory loss, suicidal ideation, hallucinations, paranoia, and confusion. Heme: Denies bruising, bleeding, and enlarged lymph nodes. Neuro:  Denies any headaches, dizziness, paresthesias. Endo:  Denies any problems with DM, thyroid, adrenal function.  Physical Exam: BP 138/90  Pulse 82  Temp 98.1 F (36.7 C) (Oral)  Ht 5\' 5"  (1.651 m)  Wt 145 lb 3.2 oz (65.862 kg)  BMI 24.16 kg/m2 No LMP recorded. Patient is postmenopausal. General:   Alert,  Well-developed, well-nourished, pleasant and cooperative in NAD.  Accompanied by her husband. Head:  Normocephalic and atraumatic. Eyes:  Sclera clear, no icterus.   Conjunctiva pink. Ears:  Normal auditory acuity. Nose:  No deformity, discharge, or lesions. Mouth:  No deformity or lesions,oropharynx pink & moist. Neck:  Supple; no masses or thyromegaly. Lungs:  Clear throughout to auscultation.   No wheezes, crackles, or rhonchi. No acute distress. Heart:  Regular rate and rhythm; no murmurs, clicks, rubs,  or gallops. Abdomen:  Normal bowel sounds.  No bruits.  Soft, non-tender and non-distended without masses, hepatosplenomegaly or hernias noted.  No guarding or rebound tenderness.   Rectal:  Deferred. Msk:  Symmetrical without gross deformities. Normal posture. Pulses:  Normal pulses noted. Extremities:  No clubbing or edema. Neurologic:  Alert and oriented x4;  grossly normal neurologically. Skin:  Intact without significant lesions or rashes. Lymph Nodes:  No significant cervical adenopathy. Psych:  Alert and cooperative. Normal mood and affect.

## 2012-03-27 NOTE — Patient Instructions (Addendum)
Begin Protonix 40mg  daily for acid reflux 1-800-QUIT-NOW for help quitting smoking Colonoscopy & EGD (Upper endoscopy) with Dr Darrick Penna Colitis Colitis is inflammation of the colon. Colitis can be a short-term or long-standing (chronic) illness. Crohn's disease and ulcerative colitis are 2 types of colitis which are chronic. They usually require lifelong treatment. CAUSES  There are many different causes of colitis, including:  Viruses.  Germs (bacteria).  Medicine reactions. SYMPTOMS   Diarrhea.  Intestinal bleeding.  Pain.  Fever.  Throwing up (vomiting).  Tiredness (fatigue).  Weight loss.  Bowel blockage. DIAGNOSIS  The diagnosis of colitis is based on examination and stool or blood tests. X-rays, CT scan, and colonoscopy may also be needed. TREATMENT  Treatment may include:  Fluids given through the vein (intravenously).  Bowel rest (nothing to eat or drink for a period of time).  Medicine for pain and diarrhea.  Medicines (antibiotics) that kill germs.  Cortisone medicines.  Surgery. HOME CARE INSTRUCTIONS   Get plenty of rest.  Drink enough water and fluids to keep your urine clear or pale yellow.  Eat a well-balanced diet.  Call your caregiver for follow-up as recommended. SEEK IMMEDIATE MEDICAL CARE IF:   You develop chills.  You have an oral temperature above 102 F (38.9 C), not controlled by medicine.  You have extreme weakness, fainting, or dehydration.  You have repeated vomiting.  You develop severe belly (abdominal) pain or are passing bloody or tarry stools. MAKE SURE YOU:   Understand these instructions.  Will watch your condition.  Will get help right away if you are not doing well or get worse. Document Released: 03/15/2004 Document Revised: 04/30/2011 Document Reviewed: 06/10/2009 [facility] Patient Information 2013 [place], [place].

## 2012-03-28 ENCOUNTER — Encounter (HOSPITAL_COMMUNITY): Payer: Self-pay | Admitting: Pharmacy Technician

## 2012-03-28 NOTE — Assessment & Plan Note (Signed)
See colitis 

## 2012-03-28 NOTE — Assessment & Plan Note (Signed)
New-onset, along with chronic nausea.  Differentials include PUD, IBD, poorly-controlled GERD, gastritis or gastroparesis.  EGD w/ Dr Darrick Penna.  Begin Protonix 40mg  daily for acid reflux

## 2012-03-28 NOTE — Assessment & Plan Note (Signed)
See GERD 

## 2012-03-28 NOTE — Assessment & Plan Note (Addendum)
Debra Barnett is a pleasant 42 y.o. female with 4 month hx of abdominal pain, nausea, & hematochezia that started with an acute episode of colitis that lead her to ER (Oct 2013).  Differentials include likely inflammatory bowel disease vs. Post-infectious IBS with benign anorectal bleeding.  She takes rare headache powders, & denies other significant NSAIDs.  Colonoscopy & EGD for further evaluation with Dr Darrick Penna.  I have discussed risks & benefits which include, but are not limited to, bleeding, infection, perforation & drug reaction.  The patient agrees with this plan & written consent will be obtained.    1-800-QUIT-NOW for help quitting smoking Colitis literature Pt declines pain meds or anti-emetics at this time

## 2012-03-31 NOTE — Progress Notes (Signed)
Faxed to PCP

## 2012-04-11 ENCOUNTER — Encounter (HOSPITAL_COMMUNITY)
Admission: RE | Disposition: A | Discharge status: Home / Self Care | Payer: Self-pay | Source: Ambulatory Visit | Attending: Gastroenterology

## 2012-04-11 ENCOUNTER — Ambulatory Visit (HOSPITAL_COMMUNITY)
Admission: RE | Admit: 2012-04-11 | Discharge: 2012-04-11 | Disposition: A | Discharge status: Home / Self Care | Payer: 59 | Source: Ambulatory Visit | Attending: Gastroenterology | Admitting: Gastroenterology

## 2012-04-11 ENCOUNTER — Encounter (HOSPITAL_COMMUNITY): Payer: Self-pay | Admitting: *Deleted

## 2012-04-11 DIAGNOSIS — R1013 Epigastric pain: Secondary | ICD-10-CM | POA: Insufficient documentation

## 2012-04-11 DIAGNOSIS — K5730 Diverticulosis of large intestine without perforation or abscess without bleeding: Secondary | ICD-10-CM

## 2012-04-11 DIAGNOSIS — K2990 Gastroduodenitis, unspecified, without bleeding: Secondary | ICD-10-CM

## 2012-04-11 DIAGNOSIS — K921 Melena: Secondary | ICD-10-CM | POA: Insufficient documentation

## 2012-04-11 DIAGNOSIS — K2970 Gastritis, unspecified, without bleeding: Secondary | ICD-10-CM

## 2012-04-11 DIAGNOSIS — K648 Other hemorrhoids: Secondary | ICD-10-CM | POA: Insufficient documentation

## 2012-04-11 DIAGNOSIS — R109 Unspecified abdominal pain: Secondary | ICD-10-CM

## 2012-04-11 DIAGNOSIS — R112 Nausea with vomiting, unspecified: Secondary | ICD-10-CM

## 2012-04-11 DIAGNOSIS — R1110 Vomiting, unspecified: Secondary | ICD-10-CM | POA: Insufficient documentation

## 2012-04-11 DIAGNOSIS — K2940 Chronic atrophic gastritis without bleeding: Secondary | ICD-10-CM | POA: Insufficient documentation

## 2012-04-11 DIAGNOSIS — K222 Esophageal obstruction: Secondary | ICD-10-CM

## 2012-04-11 HISTORY — DX: Gastro-esophageal reflux disease without esophagitis: K21.9

## 2012-04-11 HISTORY — DX: Anemia, unspecified: D64.9

## 2012-04-11 HISTORY — PX: COLONOSCOPY WITH ESOPHAGOGASTRODUODENOSCOPY (EGD): SHX5779

## 2012-04-11 HISTORY — DX: Carpal tunnel syndrome, right upper limb: G56.01

## 2012-04-11 SURGERY — COLONOSCOPY WITH ESOPHAGOGASTRODUODENOSCOPY (EGD)
Anesthesia: Moderate Sedation

## 2012-04-11 MED ORDER — MIDAZOLAM HCL 5 MG/5ML IJ SOLN
INTRAMUSCULAR | Status: AC
  Filled 2012-04-11: qty 10

## 2012-04-11 MED ORDER — MEPERIDINE HCL 100 MG/ML IJ SOLN
INTRAMUSCULAR | Status: DC | PRN
  Administered 2012-04-11: 25 mg via INTRAVENOUS
  Administered 2012-04-11: 50 mg via INTRAVENOUS
  Administered 2012-04-11: 25 mg via INTRAVENOUS

## 2012-04-11 MED ORDER — STERILE WATER FOR IRRIGATION IR SOLN
Status: DC | PRN
  Administered 2012-04-11: 09:00:00

## 2012-04-11 MED ORDER — MIDAZOLAM HCL 5 MG/5ML IJ SOLN
INTRAMUSCULAR | Status: DC | PRN
  Administered 2012-04-11 (×2): 2 mg via INTRAVENOUS
  Administered 2012-04-11 (×2): 1 mg via INTRAVENOUS

## 2012-04-11 MED ORDER — SODIUM CHLORIDE 0.45 % IV SOLN
INTRAVENOUS | Status: DC
  Administered 2012-04-11: 08:00:00 via INTRAVENOUS

## 2012-04-11 MED ORDER — BUTAMBEN-TETRACAINE-BENZOCAINE 2-2-14 % EX AERO
INHALATION_SPRAY | CUTANEOUS | Status: DC | PRN
  Administered 2012-04-11: 2 via TOPICAL

## 2012-04-11 MED ORDER — PANTOPRAZOLE SODIUM 40 MG PO TBEC: DELAYED_RELEASE_TABLET | ORAL | Status: DC

## 2012-04-11 MED ORDER — MEPERIDINE HCL 100 MG/ML IJ SOLN
INTRAMUSCULAR | Status: AC
  Filled 2012-04-11: qty 1

## 2012-04-11 NOTE — H&P (Signed)
  Primary Care Physician:  Colette Ribas, MD Primary Gastroenterologist:  Dr. Darrick Penna  Pre-Procedure History & Physical: HPI:  Debra Barnett is a 42 y.o. female here for BRBPR/GERD/VOMITING/NAUSEA.   Past Medical History  Diagnosis Date  . Arthritis   . Colitis Nov 2013  . GERD (gastroesophageal reflux disease)   . Anemia   . Carpal tunnel syndrome of right wrist     Past Surgical History  Procedure Laterality Date  . Wisdom tooth extraction  1994    Prior to Admission medications   Medication Sig Start Date End Date Taking? Authorizing Provider  calcium carbonate (TUMS - DOSED IN MG ELEMENTAL CALCIUM) 500 MG chewable tablet Chew 2-3 tablets by mouth daily as needed. For indigestion   Yes Historical Provider, MD  Melatonin 3 MG TABS Take 3 mg by mouth at bedtime.   Yes Historical Provider, MD  pantoprazole (PROTONIX) 40 MG tablet Take 1 tablet (40 mg total) by mouth daily. 03/27/12  Yes Joselyn Arrow, NP  polyethylene glycol-electrolytes (TRILYTE) 420 G solution Take 4,000 mLs by mouth as directed. 03/27/12  Yes West Bali, MD    Allergies as of 03/27/2012 - Review Complete 03/27/2012  Allergen Reaction Noted  . Adhesive (tape) Other (See Comments) 12/12/2011  . Sulfa antibiotics Itching 11/24/2011    Family History  Problem Relation Age of Onset  . Thyroid disease Mother   . Stroke Father   . Diabetes Father   . Colon cancer Neg Hx   . Colon polyps Neg Hx     History   Social History  . Marital Status: Married    Spouse Name: N/A    Number of Children: N/A  . Years of Education: N/A   Occupational History  . Not on file.   Social History Main Topics  . Smoking status: Current Every Day Smoker -- 1.00 packs/day for 22 years    Types: Cigarettes  . Smokeless tobacco: Not on file  . 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 Drug Use: No     Comment: pt reports using a friend's pain med at times for  severe pain-Past marijuana last couple weeks  . Sexually Active: Yes    Birth Control/ Protection: None, Post-menopausal   Other Topics Concern  . Not on file   Social History Narrative  . No narrative on file    Review of Systems: See HPI, otherwise negative ROS   Physical Exam: BP 151/96  Pulse 80  Temp(Src) 98.1 F (36.7 C) (Oral)  Resp 15  Ht 5\' 5"  (1.651 m)  Wt 143 lb (64.864 kg)  BMI 23.8 kg/m2  SpO2 99% General:   Alert,  pleasant and cooperative in NAD Head:  Normocephalic and atraumatic. Neck:  Supple; Lungs:  Clear throughout to auscultation.    Heart:  Regular rate and rhythm. Abdomen:  Soft, nontender and nondistended. Normal bowel sounds, without guarding, and without rebound.   Neurologic:  Alert and  oriented x4;  grossly normal neurologically.  Impression/Plan:     BRBPR/dyspepsia/vomiting  PLAN: EGD/TCS TODAY

## 2012-04-11 NOTE — Op Note (Addendum)
[facility] [address] [place], [address]   ENDOSCOPY PROCEDURE REPORT  PATIENT: Taniah, Reinecke  MR#: [id number] BIRTHDATE: 12/02/1970 , 41  yrs. old GENDER: Female  ENDOSCOPIST: Jonette Eva, MD REFERRED XB:JYNW Phillips Odor, M.D.  PROCEDURE DATE: 04/11/2012 PROCEDURE:   EGD w/ biopsy  INDICATIONS:Nausea.   Vomiting.   Epigastric pain. MEDICATIONS: TCS + Versed 1mg  IV TOPICAL ANESTHETIC:   Cetacaine Spray  DESCRIPTION OF PROCEDURE:     Physical exam was performed.  Informed consent was obtained from the patient after explaining the benefits, risks, and alternatives to the procedure.  The patient was connected to the monitor and placed in the left lateral position.  Continuous oxygen was provided by nasal cannula and IV medicine administered through an indwelling cannula.  After administration of sedation, the patients esophagus was intubated and the [id number] ([id number])  endoscope was advanced under direct visualization to the second portion of the duodenum.  The scope was removed slowly by carefully examining the color, texture, anatomy, and integrity of the mucosa on the way out.  The patient was recovered in endoscopy and discharged home in satisfactory condition.   ESOPHAGUS: A stricture was found at the gastroesophageal junction. The stenosis was traversable with the endoscope.   A small hiatal hernia was noted.   STOMACH: Mild non-erosive gastritis (inflammation) was found.  Multiple biopsies were performed. DUODENUM: The duodenal mucosa showed no abnormalities in the bulb and second portion of the duodenum.  COMPLICATIONS:   None  ENDOSCOPIC IMPRESSION: 1.   Stricture was found at the gastroesophageal junction 2.   Small hiatal hernia 3.   Non-erosive gastritis (inflammation) was found; multiple biopsies 4.   The duodenal mucosa showed no abnormalities in the bulb and second portion of the duodenum  RECOMMENDATIONS: STOP SMOKING. CONTINUE  PROTONIX.  TAKE 30 MINUTES PRIOR TO MEALS TWICE DAILY. AVOID ITEMS THAT TRIGGER GASTRITIS. FOLLOW A HIGH FIBER/LOW FAT DIET.  AVOID ITEMS THAT CAUSE BLOATING.  BIOPSY RESULTS WILL BE AVAILABLE IN 7 DAYS. FOLLOW UP IN 4 MOS.   REPEAT EXAM:   _______________________________ Rosalie DoctorJonette Eva, MD 04/11/2012 11:40 AM       PATIENT NAME:  Joeanna, Howdyshell MR#: [id number]

## 2012-04-11 NOTE — Op Note (Signed)
[facility] [address] [place], [address]   COLONOSCOPY PROCEDURE REPORT  PATIENT: Debra, Barnett  MR#: [id number] BIRTHDATE: Jun 09, 1970 , 41  yrs. old GENDER: Female ENDOSCOPIST: Jonette Eva, MD REFERRED XB:JYNW Phillips Odor, M.D. PROCEDURE DATE:  04/11/2012 PROCEDURE:   Colonoscopy, screening INDICATIONS:Rectal Bleeding and COLITIS ON CT OCT 2013.  DENIES DIARRHEA, BUT HAS INTERMITTENT UPPER ABDOMINL PAIN. MEDICATIONS: Demerol 100 mg IV and Versed 5 mg IV  DESCRIPTION OF PROCEDURE:    Physical exam was performed.  Informed consent was obtained from the patient after explaining the benefits, risks, and alternatives to procedure.  The patient was connected to monitor and placed in left lateral position. Continuous oxygen was provided by nasal cannula and IV medicine administered through an indwelling cannula.  After administration of sedation and rectal exam, the patients rectum was intubated and the Pentax Colonoscope [id number]  colonoscope was advanced under direct visualization to the ileum.  The scope was removed slowly by carefully examining the color, texture, anatomy, and integrity mucosa on the way out.  The patient was recovered in endoscopy and discharged home in satisfactory condition.    COLON FINDINGS: The mucosa appeared normal in the terminal ileum.  10 CM VISUALIZED.  Mild diverticulosis was noted in the sigmoid colon.  The colon mucosa was otherwise normal.  , and Small internal hemorrhoids were found.  PREP QUALITY: excellent. CECAL W/D TIME: 9 minutes  COMPLICATIONS: None  ENDOSCOPIC IMPRESSION: 1.   Mild diverticulosis was noted in the sigmoid colon 2.   Small internal hemorrhoids 3.  COLITIS RESOLVED  RECOMMENDATIONS: HIGH FIBER DIET TCS IN 10 YEARS       _______________________________ eSignedJonette Eva, MD 04/11/2012 11:33 AM     PATIENT NAME:  Debra, Barnett MR#: [id number]

## 2012-04-14 ENCOUNTER — Encounter (HOSPITAL_COMMUNITY): Payer: Self-pay | Admitting: Gastroenterology

## 2012-04-17 ENCOUNTER — Telehealth: Payer: Self-pay | Admitting: Gastroenterology

## 2012-04-17 NOTE — Telephone Encounter (Signed)
Called and informed pt.  

## 2012-04-17 NOTE — Telephone Encounter (Signed)
Path faxed to PCP, recalls made 

## 2012-04-17 NOTE — Telephone Encounter (Signed)
Please call pt. HER stomach Bx shows gastritis.    STOP SMOKING.  CONTINUE PROTONIX. TAKE 30 MINUTES PRIOR TO MEALS TWICE DAILY.  AVOID ITEMS THAT TRIGGER GASTRITIS.   FOLLOW A HIGH FIBER/LOW FAT DIET. AVOID ITEMS THAT CAUSE BLOATING.   FOLLOW UP IN 4 MOS.   Next colonoscopy in 10 years.

## 2012-06-26 NOTE — Progress Notes (Signed)
FEB 2014 GASTRITIS, TICS, IH  REVIEWED.

## 2012-08-05 ENCOUNTER — Encounter: Payer: Self-pay | Admitting: Gastroenterology

## 2012-08-28 ENCOUNTER — Other Ambulatory Visit: Payer: Self-pay

## 2012-08-28 ENCOUNTER — Telehealth: Payer: Self-pay

## 2012-08-28 DIAGNOSIS — K625 Hemorrhage of anus and rectum: Secondary | ICD-10-CM

## 2012-08-28 MED ORDER — HYDROCORTISONE 2.5 % RE CREA: TOPICAL_CREAM | Freq: Two times a day (BID) | RECTAL | Status: DC

## 2012-08-28 NOTE — Telephone Encounter (Signed)
Pt called and said she is having some rectal bleeding today. She has had 4 BM's today and every time she has one, she has quite a bit of bright red blood in the comode and on the paper when she wipes. She had colonoscopy in Feb 2014 and she is aware that she had internal hemorrhoids. Please advise! ( Pt can be reached at work [phone] or cell [phone]. ) Routing to Gerrit Halls, NP since Dr. Darrick Penna has left for the day.

## 2012-08-28 NOTE — Telephone Encounter (Signed)
Called and informed pt. CBC order faxed to [facility].

## 2012-08-28 NOTE — Telephone Encounter (Signed)
Obtain CBC.  Anusol cream BID X 7 days sent to pharmacy. Contact us if no improvement.

## 2012-08-29 LAB — CBC WITH DIFFERENTIAL/PLATELET
Basophils Absolute: 0 10*3/uL (ref 0.0–0.1)
Basophils Relative: 0 % (ref 0–1)
Eosinophils Absolute: 0.1 10*3/uL (ref 0.0–0.7)
HCT: 43 % (ref 36.0–46.0)
Hemoglobin: 14.8 g/dL (ref 12.0–15.0)
MCH: 29.1 pg (ref 26.0–34.0)
MCHC: 34.4 g/dL (ref 30.0–36.0)
Monocytes Absolute: 1 10*3/uL (ref 0.1–1.0)
Monocytes Relative: 9 % (ref 3–12)
Neutro Abs: 5.7 10*3/uL (ref 1.7–7.7)
Neutrophils Relative %: 56 % (ref 43–77)
RDW: 14.2 % (ref 11.5–15.5)

## 2012-09-02 NOTE — Progress Notes (Signed)
Quick Note:  CBC completely normal. Continue Anusol as prescribed. May need banding; offer f/u with Dr. Darrick Penna ______

## 2012-09-03 NOTE — Progress Notes (Signed)
Quick Note:  Called and informed pt. She said she is not bleeding. Thinks it could have been something that she eat. She did have a menstrual cycle after 2 years without one. But everything seems ok now. She declined appt with Dr. Darrick Penna at this time and will call if she needs to. ______

## 2013-11-19 ENCOUNTER — Emergency Department (HOSPITAL_COMMUNITY): Payer: 59

## 2013-11-19 ENCOUNTER — Encounter (HOSPITAL_COMMUNITY): Payer: Self-pay | Admitting: Emergency Medicine

## 2013-11-19 ENCOUNTER — Emergency Department (HOSPITAL_COMMUNITY)
Admission: EM | Admit: 2013-11-19 | Discharge: 2013-11-19 | Disposition: A | Discharge status: Home / Self Care | Payer: 59 | Attending: Emergency Medicine | Admitting: Emergency Medicine

## 2013-11-19 DIAGNOSIS — Z8719 Personal history of other diseases of the digestive system: Secondary | ICD-10-CM | POA: Insufficient documentation

## 2013-11-19 DIAGNOSIS — Z79899 Other long term (current) drug therapy: Secondary | ICD-10-CM | POA: Insufficient documentation

## 2013-11-19 DIAGNOSIS — M5030 Other cervical disc degeneration, unspecified cervical region: Secondary | ICD-10-CM

## 2013-11-19 DIAGNOSIS — Z720 Tobacco use: Secondary | ICD-10-CM | POA: Insufficient documentation

## 2013-11-19 DIAGNOSIS — M1990 Unspecified osteoarthritis, unspecified site: Secondary | ICD-10-CM | POA: Insufficient documentation

## 2013-11-19 DIAGNOSIS — Z862 Personal history of diseases of the blood and blood-forming organs and certain disorders involving the immune mechanism: Secondary | ICD-10-CM | POA: Insufficient documentation

## 2013-11-19 DIAGNOSIS — M542 Cervicalgia: Secondary | ICD-10-CM | POA: Insufficient documentation

## 2013-11-19 DIAGNOSIS — G5601 Carpal tunnel syndrome, right upper limb: Secondary | ICD-10-CM | POA: Insufficient documentation

## 2013-11-19 MED ORDER — DEXAMETHASONE 4 MG PO TABS: ORAL_TABLET | ORAL | Status: DC

## 2013-11-19 MED ORDER — IBUPROFEN 800 MG PO TABS: 800.0000 mg | ORAL_TABLET | Freq: Three times a day (TID) | ORAL | Status: DC

## 2013-11-19 MED ORDER — KETOROLAC TROMETHAMINE 10 MG PO TABS
10.0000 mg | ORAL_TABLET | Freq: Once | ORAL | Status: AC
  Administered 2013-11-19: 10 mg via ORAL
  Filled 2013-11-19: qty 1

## 2013-11-19 MED ORDER — ONDANSETRON HCL 4 MG PO TABS
4.0000 mg | ORAL_TABLET | Freq: Once | ORAL | Status: AC
  Administered 2013-11-19: 4 mg via ORAL
  Filled 2013-11-19: qty 1

## 2013-11-19 MED ORDER — HYDROCODONE-ACETAMINOPHEN 5-325 MG PO TABS
2.0000 | ORAL_TABLET | Freq: Once | ORAL | Status: AC
  Administered 2013-11-19: 2 via ORAL
  Filled 2013-11-19: qty 2

## 2013-11-19 MED ORDER — HYDROCODONE-ACETAMINOPHEN 5-325 MG PO TABS: 1.0000 | ORAL_TABLET | ORAL | Status: DC | PRN

## 2013-11-19 NOTE — ED Provider Notes (Signed)
CSN: [id number]     Arrival date & time 11/19/13  1013 History   First MD Initiated Contact with Patient 11/19/13 1027     Chief Complaint  Patient presents with  . Arm Pain     (Consider location/radiation/quality/duration/timing/severity/associated sxs/prior Treatment) Patient is a 43 y.o. female presenting with arm pain. The history is provided by the patient.  Arm Pain This is a new problem. The current episode started in the past 7 days. Associated symptoms include arthralgias and neck pain. Pertinent negatives include no abdominal pain, chest pain or coughing.    Past Medical History  Diagnosis Date  . Arthritis   . Colitis Nov 2013  . GERD (gastroesophageal reflux disease)   . Anemia   . Carpal tunnel syndrome of right wrist    Past Surgical History  Procedure Laterality Date  . Wisdom tooth extraction  1994  . Colonoscopy with esophagogastroduodenoscopy (egd) N/A 04/11/2012    Procedure: COLONOSCOPY WITH ESOPHAGOGASTRODUODENOSCOPY (EGD);  Surgeon: West Bali, MD;  Location: [facility];  Service: Endoscopy;  Laterality: N/A;  8:30   Family History  Problem Relation Age of Onset  . Thyroid disease Mother   . Stroke Father   . Diabetes Father   . Colon cancer Neg Hx   . Colon polyps Neg Hx    History  Substance Use Topics  . Smoking status: Current Every Day Smoker -- 1.00 packs/day for 22 years    Types: Cigarettes  . Smokeless tobacco: Not on file  . 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       All ROS Neg except as noted in HPI  HENT: Negative for nosebleeds.   Eyes: Negative for photophobia and discharge.  Respiratory: Negative for cough, shortness of breath and wheezing.   Cardiovascular: Negative for chest pain and palpitations.   Gastrointestinal: Negative for abdominal pain and blood in stool.  Genitourinary: Negative for dysuria, frequency and hematuria.  Musculoskeletal: Positive for arthralgias and neck pain. Negative for back pain.  Skin: Negative.   Neurological: Negative for dizziness, seizures and speech difficulty.  Psychiatric/Behavioral: Negative for hallucinations and confusion.      Allergies  Adhesive and Sulfa antibiotics  Home Medications   Prior to Admission medications   Medication Sig Start Date End Date Taking? Authorizing Provider  calcium carbonate (TUMS - DOSED IN MG ELEMENTAL CALCIUM) 500 MG chewable tablet Chew 2-3 tablets by mouth daily as needed. For indigestion   Yes Historical Provider, MD  ibuprofen (ADVIL,MOTRIN) 200 MG tablet Take 600 mg by mouth daily as needed for moderate pain.   Yes Historical Provider, MD  Melatonin 5 MG TABS Take 15 mg by mouth at bedtime.   Yes Historical Provider, MD   BP 174/102  Pulse 86  Temp(Src) 98 F (36.7 C) (Oral)  Resp 20  SpO2 100% Physical Exam  Nursing note and vitals reviewed. Constitutional: She is oriented to person, place, and time. She appears well-developed and well-nourished.  Non-toxic appearance.  HENT:  Head: Normocephalic.  Right Ear: Tympanic membrane and external ear normal.  Left Ear: Tympanic membrane and external ear normal.  Eyes: EOM and lids are normal. Pupils are equal, round,  and reactive to light.  Neck: Normal range of motion. Neck supple. Carotid bruit is not present.  Cardiovascular: Normal rate, regular rhythm, normal heart sounds, intact distal pulses and normal pulses.   Pulmonary/Chest: Breath sounds normal. No respiratory distress.  Abdominal: Soft. Bowel sounds are normal. There is no tenderness. There is no guarding.  Musculoskeletal: Normal range of motion. She exhibits tenderness.    Pos Tinel's sign right.  Decrease ROM of the cervical spine. No palpable step off.  Lymphadenopathy:       Head  (right side): No submandibular adenopathy present.       Head (left side): No submandibular adenopathy present.    She has no cervical adenopathy.  Neurological: She is alert and oriented to person, place, and time. She has normal strength. No cranial nerve deficit or sensory deficit.  Skin: Skin is warm and dry.  Psychiatric: She has a normal mood and affect. Her speech is normal.    ED Course  Procedures (including critical care time) Labs Review Labs Reviewed - No data to display  Imaging Review Dg Cervical Spine Complete  11/19/2013   CLINICAL DATA:  One week history of progressive generalized neck pain; no recent trauma  EXAM: CERVICAL SPINE  4+ VIEWS  COMPARISON:  None.  FINDINGS: Frontal, lateral, open-mouth odontoid, and bilateral oblique views were obtained. There is no fracture or spondylolisthesis. Prevertebral soft tissues and predental space regions are normal. There is mild disc space narrowing at C4-5. Other disc spaces appear normal. There is no appreciable exit foraminal narrowing on the oblique views.  IMPRESSION: Disc space narrowing at C4-5, fairly mild. Other disc spaces appear unremarkable. No fracture or spondylolisthesis.   Electronically Signed   By: Bretta BangWilliam  Woodruff M.D.   On: 11/19/2013 11:32     EKG Interpretation None      MDM  There no gross neurologic deficits appreciated on examination today. Vital signs are within normal limits. There is no atrophy of the thenar eminence. Pain improved with the wrist splint as well as Toradol and Norco. Cervical spine x-ray reveals disc space narrowing at the C4-C5 area, no other problems appreciated no fracture appreciated. Suspect the patient has an exacerbation of her carpal tunnel, but cannot rule out cervical radiculopathy. Patient referred to orthopedics. Prescription for ibuprofen 800 mg and Norco given to the patient.    Final diagnoses:  Carpal tunnel syndrome of right wrist  DDD (degenerative disc disease),  cervical    *I have reviewed nursing notes, vital signs, and all appropriate lab and imaging results for this patient.Kathie Dike**    Keli Buehner M Essam Lowdermilk, PA-C 11/19/13 1206

## 2013-11-19 NOTE — ED Provider Notes (Signed)
Medical screening examination/treatment/procedure(s) were performed by non-physician practitioner and as supervising physician I was immediately available for consultation/collaboration.   EKG Interpretation None        Glynn OctaveStephen Earlyn Sylvan, MD 11/19/13 [phone]

## 2013-11-19 NOTE — Discharge Instructions (Signed)
Your x-ray shows some degenerative disc disease changes at the C4 area in your neck. Your examination suggested carpal tunnel syndrome on the right. Please see the orthopedist listed above, or the orthopedist of your choice for evaluation and management of these problems. Please use your wrist splint, particularly at work until you're is seen and evaluated by the orthopedic specialist. Please use ibuprofen 800 mg and dexamethasone 4 mg daily with food. Use Norco every 4 hours if needed for pain. Norco may cause drowsiness, please use with caution. Carpal Tunnel Syndrome The carpal tunnel is a narrow area located on the palm side of your wrist. The tunnel is formed by the wrist bones and ligaments. Nerves, blood vessels, and tendons pass through the carpal tunnel. Repeated wrist motion or certain diseases may cause swelling within the tunnel. This swelling pinches the main nerve in the wrist (median nerve) and causes the painful hand and arm condition called carpal tunnel syndrome. CAUSES   Repeated wrist motions.  Wrist injuries.  Certain diseases like arthritis, diabetes, alcoholism, hyperthyroidism, and kidney failure.  Obesity.  Pregnancy. SYMPTOMS   A "pins and needles" feeling in your fingers or hand, especially in your thumb, index and middle fingers.  Tingling or numbness in your fingers or hand.  An aching feeling in your entire arm, especially when your wrist and elbow are bent for long periods of time.  Wrist pain that goes up your arm to your shoulder.  Pain that goes down into your palm or fingers.  A weak feeling in your hands. DIAGNOSIS  Your health care provider will take your history and perform a physical exam. An electromyography test may be needed. This test measures electrical signals sent out by your nerves into the muscles. The electrical signals are usually slowed by carpal tunnel syndrome. You may also need X-rays. TREATMENT  Carpal tunnel syndrome may clear up  by itself. Your health care provider may recommend a wrist splint or medicine such as a nonsteroidal anti-inflammatory medicine. Cortisone injections may help. Sometimes, surgery may be needed to free the pinched nerve.  HOME CARE INSTRUCTIONS   Take all medicine as directed by your health care provider. Only take over-the-counter or prescription medicines for pain, discomfort, or fever as directed by your health care provider.  If you were given a splint to keep your wrist from bending, wear it as directed. It is important to wear the splint at night. Wear the splint for as long as you have pain or numbness in your hand, arm, or wrist. This may take 1 to 2 months.  Rest your wrist from any activity that may be causing your pain. If your symptoms are work-related, you may need to talk to your employer about changing to a job that does not require using your wrist.  Put ice on your wrist after long periods of wrist activity.  Put ice in a plastic bag.  Place a towel between your skin and the bag.  Leave the ice on for 15-20 minutes, 03-04 times a day.  Keep all follow-up visits as directed by your health care provider. This includes any orthopedic referrals, physical therapy, and rehabilitation. Any delay in getting necessary care could result in a delay or failure of your condition to heal. SEEK IMMEDIATE MEDICAL CARE IF:   You have new, unexplained symptoms.  Your symptoms get worse and are not helped or controlled with medicines. MAKE SURE YOU:   Understand these instructions.  Will watch your condition.  Will  get help right away if you are not doing well or get worse. Document Released: 02/03/2000 Document Revised: 06/22/2013 Document Reviewed: 12/22/2010 [facility] Patient Information 2015 [place], [place]. This information is not intended to replace advice given to you by your health care provider. Make sure you discuss any questions you have with your health care  provider.  Degenerative Disk Disease Degenerative disk disease is a condition caused by the changes that occur in the cushions of the backbone (spinal disks) as you grow older. Spinal disks are soft and compressible disks located between the bones of the spine (vertebrae). They act like shock absorbers. Degenerative disk disease can affect the whole spine. However, the neck and lower back are most commonly affected. Many changes can occur in the spinal disks with aging, such as:  The spinal disks may dry and shrink.  Small tears may occur in the tough, outer covering of the disk (annulus).  The disk space may become smaller due to loss of water.  Abnormal growths in the bone (spurs) may occur. This can put pressure on the nerve roots exiting the spinal canal, causing pain.  The spinal canal may become narrowed. CAUSES  Degenerative disk disease is a condition caused by the changes that occur in the spinal disks with aging. The exact cause is not known, but there is a genetic basis for many patients. Degenerative changes can occur due to loss of fluid in the disk. This makes the disk thinner and reduces the space between the backbones. Small cracks can develop in the outer layer of the disk. This can lead to the breakdown of the disk. You are more likely to get degenerative disk disease if you are overweight. Smoking cigarettes and doing heavy work such as weightlifting can also increase your risk of this condition. Degenerative changes can start after a sudden injury. Growth of bone spurs can compress the nerve roots and cause pain.  SYMPTOMS  The symptoms vary from person to person. Some people may have no pain, while others have severe pain. The pain may be so severe that it can limit your activities. The location of the pain depends on the part of your backbone that is affected. You will have neck or arm pain if a disk in the neck area is affected. You will have pain in your back, buttocks, or  legs if a disk in the lower back is affected. The pain becomes worse while bending, reaching up, or with twisting movements. The pain may start gradually and then get worse as time passes. It may also start after a major or minor injury. You may feel numbness or tingling in the arms or legs.  DIAGNOSIS  Your caregiver will ask you about your symptoms and about activities or habits that may cause the pain. He or she may also ask about any injuries, diseases, or treatments you have had earlier. Your caregiver will examine you to check for the range of movement that is possible in the affected area, to check for strength in your extremities, and to check for sensation in the areas of the arms and legs supplied by different nerve roots. An X-ray of the spine may be taken. Your caregiver may suggest other imaging tests, such as magnetic resonance imaging (MRI), if needed.  TREATMENT  Treatment includes rest, modifying your activities, and applying ice and heat. Your caregiver may prescribe medicines to reduce your pain and may ask you to do some exercises to strengthen your back. In some cases,  you may need surgery. You and your caregiver will decide on the treatment that is best for you. HOME CARE INSTRUCTIONS   Follow proper lifting and walking techniques as advised by your caregiver.  Maintain good posture.  Exercise regularly as advised.  Perform relaxation exercises.  Change your sitting, standing, and sleeping habits as advised. Change positions frequently.  Lose weight as advised.  Stop smoking if you smoke.  Wear supportive footwear. SEEK MEDICAL CARE IF:  Your pain does not go away within 1 to 4 weeks. SEEK IMMEDIATE MEDICAL CARE IF:   Your pain is severe.  You notice weakness in your arms, hands, or legs.  You begin to lose control of your bladder or bowel movements. MAKE SURE YOU:   Understand these instructions.  Will watch your condition.  Will get help right away if you  are not doing well or get worse. Document Released: 12/03/2006 Document Revised: 04/30/2011 Document Reviewed: 06/09/2013 [facility] Patient Information 2015 [place], [place]. This information is not intended to replace advice given to you by your health care provider. Make sure you discuss any questions you have with your health care provider.

## 2013-11-19 NOTE — ED Notes (Signed)
Pt reports right arm pain x 1 week, intermittent numbness to hand. Pain at times radiates into neck/back.

## 2014-10-18 IMAGING — CR DG CHEST 2V
2 series · 2 of 2 positions shown · non-contrast
Comparison: At 08/06/2010

CLINICAL DATA: Vomiting and diarrhea

CHEST - 2 VIEW

[view not recorded (1 of 2)]
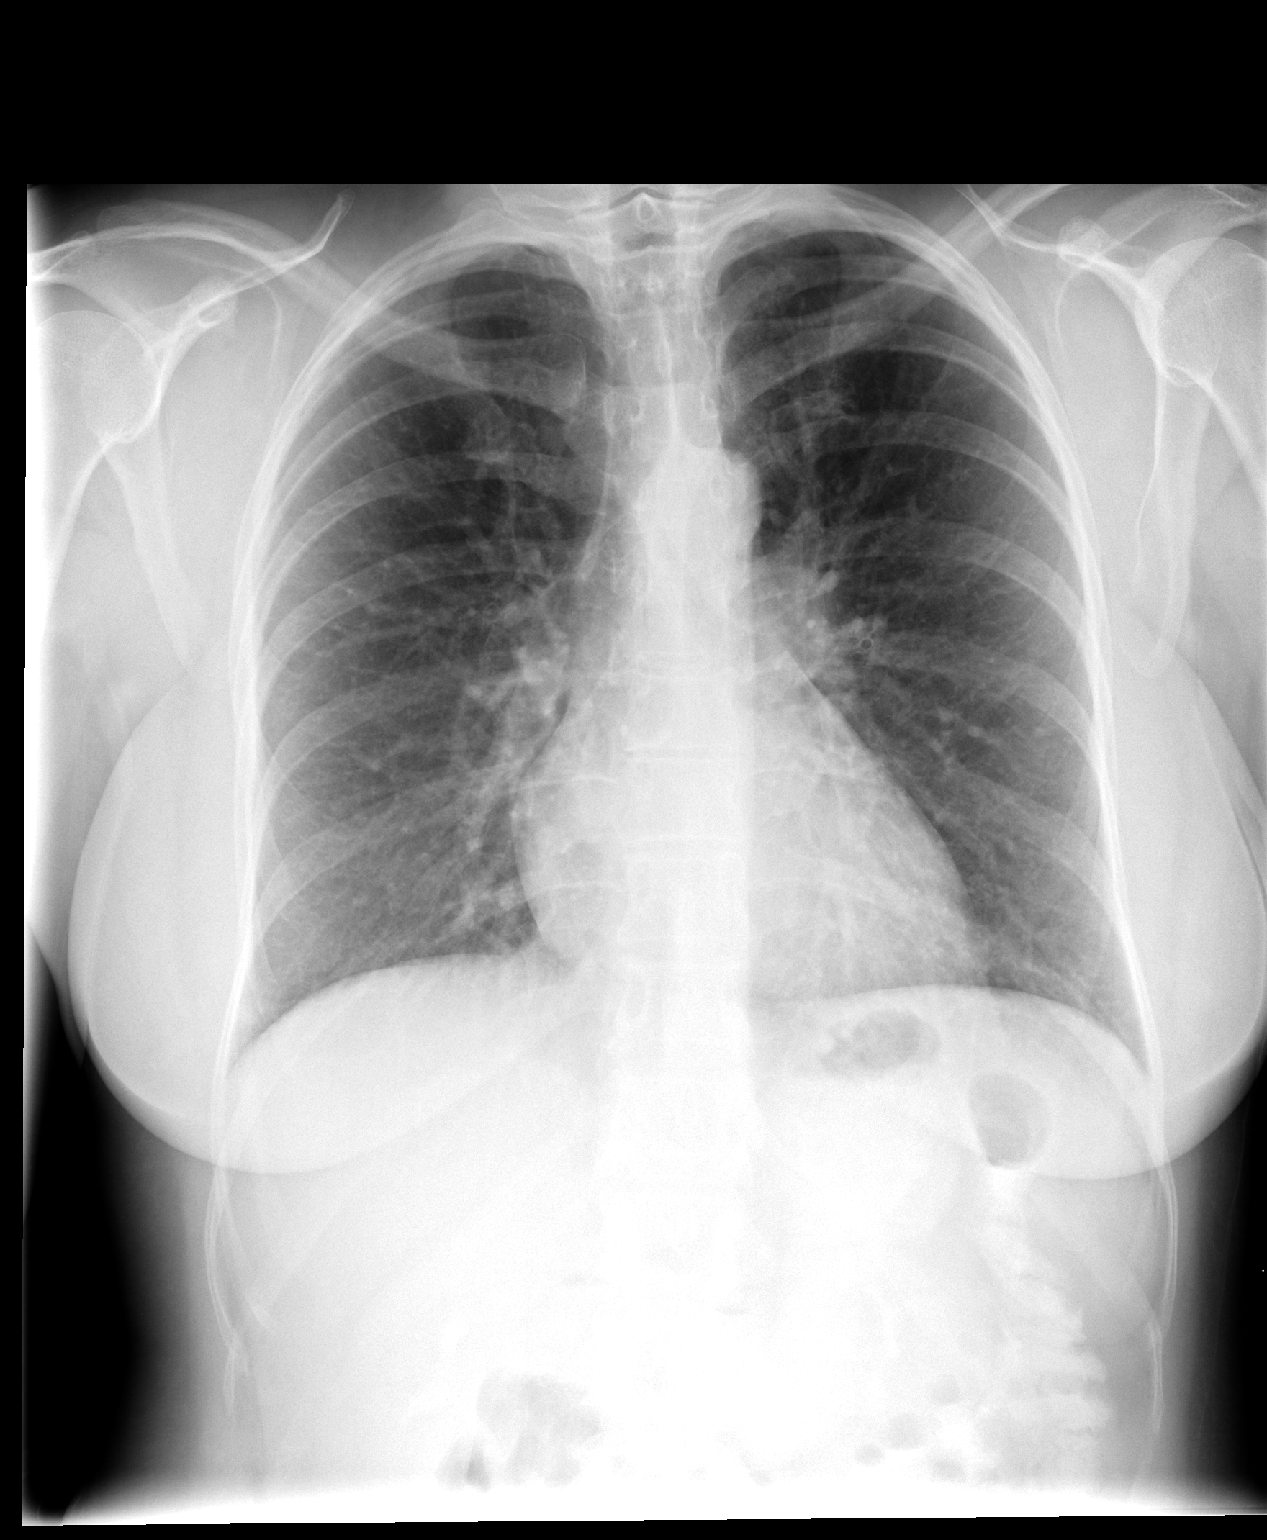

[view not recorded (2 of 2)]
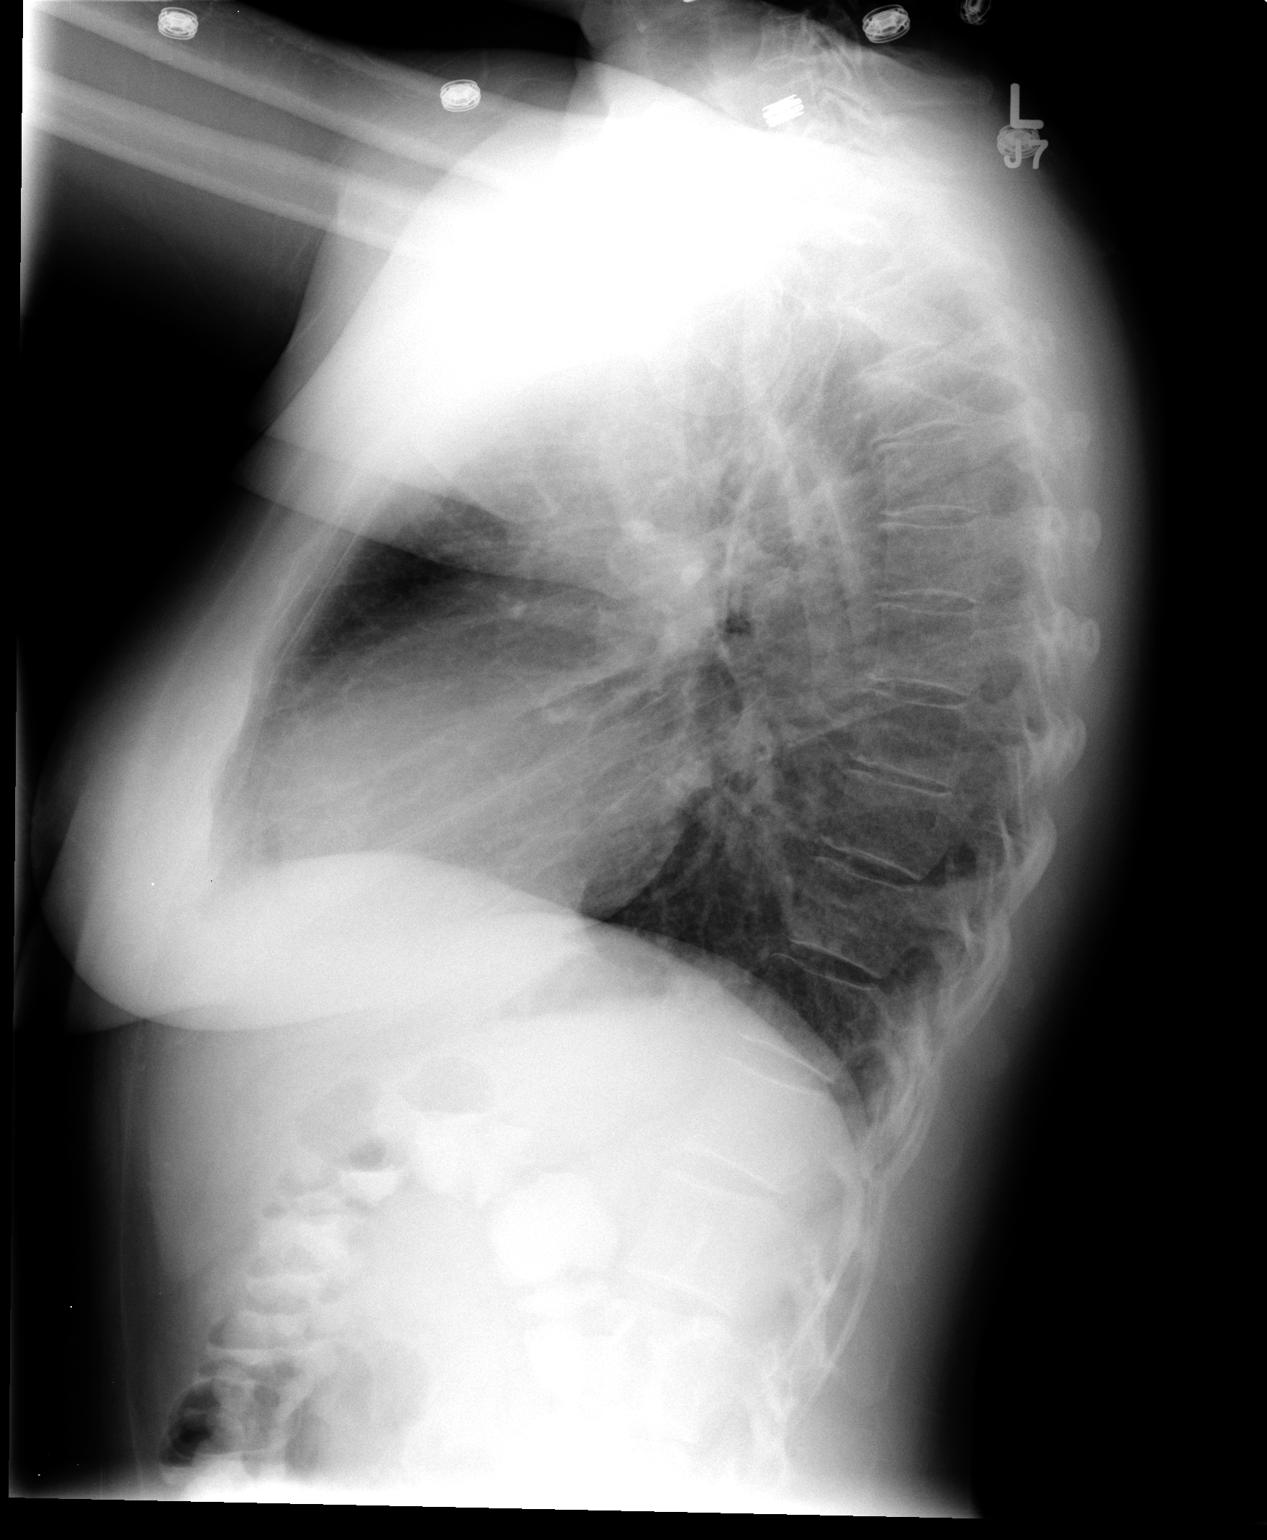

[2 of 2 positions shown; findings below may reference images not displayed]

FINDINGS: Heart size and vascular pattern are normal.  The lungs
are clear.
IMPRESSION: Negative

## 2015-07-07 ENCOUNTER — Encounter (HOSPITAL_COMMUNITY): Payer: Self-pay | Admitting: *Deleted

## 2015-07-07 ENCOUNTER — Emergency Department (HOSPITAL_COMMUNITY)
Admission: EM | Admit: 2015-07-07 | Discharge: 2015-07-07 | Disposition: A | Discharge status: Home / Self Care | Payer: No Typology Code available for payment source | Attending: Emergency Medicine | Admitting: Emergency Medicine

## 2015-07-07 ENCOUNTER — Emergency Department (HOSPITAL_COMMUNITY): Payer: No Typology Code available for payment source

## 2015-07-07 DIAGNOSIS — F17210 Nicotine dependence, cigarettes, uncomplicated: Secondary | ICD-10-CM | POA: Diagnosis not present

## 2015-07-07 DIAGNOSIS — M1990 Unspecified osteoarthritis, unspecified site: Secondary | ICD-10-CM | POA: Diagnosis not present

## 2015-07-07 DIAGNOSIS — Y999 Unspecified external cause status: Secondary | ICD-10-CM | POA: Insufficient documentation

## 2015-07-07 DIAGNOSIS — Y92410 Unspecified street and highway as the place of occurrence of the external cause: Secondary | ICD-10-CM | POA: Diagnosis not present

## 2015-07-07 DIAGNOSIS — Y939 Activity, unspecified: Secondary | ICD-10-CM | POA: Diagnosis not present

## 2015-07-07 DIAGNOSIS — M25511 Pain in right shoulder: Secondary | ICD-10-CM | POA: Insufficient documentation

## 2015-07-07 DIAGNOSIS — Z79899 Other long term (current) drug therapy: Secondary | ICD-10-CM | POA: Diagnosis not present

## 2015-07-07 HISTORY — DX: Diverticulosis of intestine, part unspecified, without perforation or abscess without bleeding: K57.90

## 2015-07-07 HISTORY — DX: Diaphragmatic hernia without obstruction or gangrene: K44.9

## 2015-07-07 MED ORDER — IBUPROFEN 800 MG PO TABS: 800.0000 mg | ORAL_TABLET | Freq: Three times a day (TID) | ORAL | Status: DC

## 2015-07-07 MED ORDER — HYDROCODONE-ACETAMINOPHEN 5-325 MG PO TABS
1.0000 | ORAL_TABLET | Freq: Once | ORAL | Status: AC
  Administered 2015-07-07: 1 via ORAL
  Filled 2015-07-07: qty 1

## 2015-07-07 MED ORDER — KETOROLAC TROMETHAMINE 60 MG/2ML IM SOLN
60.0000 mg | Freq: Once | INTRAMUSCULAR | Status: AC
  Administered 2015-07-07: 60 mg via INTRAMUSCULAR
  Filled 2015-07-07: qty 2

## 2015-07-07 MED ORDER — CYCLOBENZAPRINE HCL 10 MG PO TABS: 10.0000 mg | ORAL_TABLET | Freq: Two times a day (BID) | ORAL | Status: DC | PRN

## 2015-07-07 MED ORDER — DIAZEPAM 5 MG PO TABS
5.0000 mg | ORAL_TABLET | Freq: Once | ORAL | Status: AC
  Administered 2015-07-07: 5 mg via ORAL
  Filled 2015-07-07: qty 1
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
## 2015-07-07 NOTE — ED Notes (Signed)
MD at bedside. 
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
## 2016-09-11 ENCOUNTER — Encounter: Payer: Self-pay | Admitting: Family Medicine

## 2016-09-11 ENCOUNTER — Ambulatory Visit (INDEPENDENT_AMBULATORY_CARE_PROVIDER_SITE_OTHER): Payer: 59 | Admitting: Family Medicine

## 2016-09-11 ENCOUNTER — Other Ambulatory Visit: Payer: Self-pay | Admitting: Family Medicine

## 2016-09-11 VITALS — BP 152/86 | HR 96 | Temp 98.4°F | Resp 16 | Ht 65.0 in | Wt 116.0 lb

## 2016-09-11 DIAGNOSIS — I10 Essential (primary) hypertension: Secondary | ICD-10-CM

## 2016-09-11 DIAGNOSIS — Z720 Tobacco use: Secondary | ICD-10-CM

## 2016-09-11 DIAGNOSIS — F331 Major depressive disorder, recurrent, moderate: Secondary | ICD-10-CM

## 2016-09-11 DIAGNOSIS — M542 Cervicalgia: Secondary | ICD-10-CM

## 2016-09-11 DIAGNOSIS — K5730 Diverticulosis of large intestine without perforation or abscess without bleeding: Secondary | ICD-10-CM

## 2016-09-11 DIAGNOSIS — G8929 Other chronic pain: Secondary | ICD-10-CM | POA: Diagnosis not present

## 2016-09-11 DIAGNOSIS — K5790 Diverticulosis of intestine, part unspecified, without perforation or abscess without bleeding: Secondary | ICD-10-CM | POA: Insufficient documentation

## 2016-09-11 DIAGNOSIS — F411 Generalized anxiety disorder: Secondary | ICD-10-CM | POA: Diagnosis not present

## 2016-09-11 LAB — COMPLETE METABOLIC PANEL WITH GFR
ALT: 27 U/L (ref 6–29)
AST: 22 U/L (ref 10–35)
Albumin: 4.3 g/dL (ref 3.6–5.1)
Alkaline Phosphatase: 178 U/L — ABNORMAL HIGH (ref 33–115)
BUN: 9 mg/dL (ref 7–25)
CALCIUM: 10 mg/dL (ref 8.6–10.2)
CHLORIDE: 103 mmol/L (ref 98–110)
CO2: 23 mmol/L (ref 20–31)
Creat: 0.5 mg/dL (ref 0.50–1.10)
Glucose, Bld: 105 mg/dL — ABNORMAL HIGH (ref 65–99)
Potassium: 5 mmol/L (ref 3.5–5.3)
Sodium: 137 mmol/L (ref 135–146)
Total Bilirubin: 0.4 mg/dL (ref 0.2–1.2)
Total Protein: 7 g/dL (ref 6.1–8.1)

## 2016-09-11 LAB — CBC
HEMATOCRIT: 42.8 % (ref 35.0–45.0)
Hemoglobin: 14.5 g/dL (ref 11.7–15.5)
MCH: 27.9 pg (ref 27.0–33.0)
MCHC: 33.9 g/dL (ref 32.0–36.0)
MCV: 82.3 fL (ref 80.0–100.0)
MPV: 10.3 fL (ref 7.5–12.5)
PLATELETS: 384 10*3/uL (ref 140–400)
RBC: 5.2 MIL/uL — ABNORMAL HIGH (ref 3.80–5.10)
RDW: 13.1 % (ref 11.0–15.0)
WBC: 8.5 10*3/uL (ref 3.8–10.8)

## 2016-09-11 LAB — LIPID PANEL
CHOLESTEROL: 109 mg/dL (ref <200)
HDL: 40 mg/dL — AB (ref >50)
LDL Cholesterol: 53 mg/dL (ref <100)
TRIGLYCERIDES: 81 mg/dL (ref <150)
Total CHOL/HDL Ratio: 2.7 Ratio (ref <5.0)
VLDL: 16 mg/dL (ref <30)

## 2016-09-11 LAB — TSH: TSH: <0.01 mIU/L — ABNORMAL LOW

## 2016-09-11 MED ORDER — LISINOPRIL 10 MG PO TABS: 10.0000 mg | ORAL_TABLET | Freq: Every day | ORAL | 3 refills | Status: DC

## 2016-09-11 MED ORDER — OMEPRAZOLE 40 MG PO CPDR: 40.0000 mg | DELAYED_RELEASE_CAPSULE | Freq: Every day | ORAL | 3 refills | Status: DC

## 2016-09-11 MED ORDER — CITALOPRAM HYDROBROMIDE 20 MG PO TABS: 20.0000 mg | ORAL_TABLET | Freq: Every day | ORAL | 3 refills | Status: DC

## 2016-09-11 NOTE — Patient Instructions (Signed)
Take the omeprazole once a day on an empty stomach This is to reduce GERD symptoms and acid reflux  Take the lisinopril daily for BP Take the citalopram once a day for anxiety/depression Call for side effects or problems  Get labs today  Come back for a PAP and PE
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
## 2016-09-12 ENCOUNTER — Telehealth: Payer: Self-pay

## 2016-09-12 ENCOUNTER — Other Ambulatory Visit: Payer: Self-pay | Admitting: Family Medicine

## 2016-09-12 DIAGNOSIS — E0590 Thyrotoxicosis, unspecified without thyrotoxic crisis or storm: Secondary | ICD-10-CM

## 2016-09-12 LAB — T4: T4 TOTAL: 23.8 ug/dL — AB (ref 4.5–12.0)

## 2016-09-12 LAB — URINALYSIS, ROUTINE W REFLEX MICROSCOPIC
Bilirubin Urine: NEGATIVE
GLUCOSE, UA: NEGATIVE
Hgb urine dipstick: NEGATIVE
Ketones, ur: NEGATIVE
LEUKOCYTES UA: NEGATIVE
Nitrite: NEGATIVE
PH: 7 (ref 5.0–8.0)
Protein, ur: NEGATIVE
SPECIFIC GRAVITY, URINE: 1.017 (ref 1.001–1.035)

## 2016-09-12 LAB — VITAMIN D 25 HYDROXY (VIT D DEFICIENCY, FRACTURES): Vit D, 25-Hydroxy: 33 ng/mL (ref 30–100)

## 2016-09-12 NOTE — Telephone Encounter (Signed)
-----   Message from Debra MooreYvonne Sue [name], MD sent at 09/12/2016  8:16 AM EDT ----- Call lab, can they add T3 T4 to the blood drawn yest?

## 2016-09-12 NOTE — Telephone Encounter (Signed)
Yes, done.  

## 2016-09-13 ENCOUNTER — Encounter: Payer: Self-pay | Admitting: Family Medicine

## 2016-09-13 ENCOUNTER — Telehealth: Payer: Self-pay

## 2016-09-13 LAB — T3: T3, Total: 640 ng/dL — ABNORMAL HIGH (ref 76–181)

## 2016-09-13 NOTE — Telephone Encounter (Signed)
-----   Message from Eustace MooreYvonne Sue [name], MD sent at 09/12/2016 12:25 PM EDT ----- Call patient and tell she is hyperthyroid.  Confirm that this causes weight loss, anxiety and her jittery feeling.  I have referred her to an endocrinologist.  It is important she keep this appt.

## 2016-09-13 NOTE — Progress Notes (Signed)
Pt aware.

## 2016-10-16 ENCOUNTER — Ambulatory Visit (INDEPENDENT_AMBULATORY_CARE_PROVIDER_SITE_OTHER): Payer: 59 | Admitting: "Endocrinology

## 2016-10-16 ENCOUNTER — Encounter: Payer: Self-pay | Admitting: "Endocrinology

## 2016-10-16 VITALS — BP 114/74 | HR 77 | Ht 65.0 in | Wt 111.0 lb

## 2016-10-16 DIAGNOSIS — E0590 Thyrotoxicosis, unspecified without thyrotoxic crisis or storm: Secondary | ICD-10-CM | POA: Insufficient documentation

## 2016-10-16 MED ORDER — PROPRANOLOL HCL 20 MG PO TABS: 20.0000 mg | ORAL_TABLET | Freq: Two times a day (BID) | ORAL | 1 refills | Status: DC

## 2016-10-16 NOTE — Progress Notes (Signed)
Subjective:    Patient ID: Debra Barnett, female    DOB: 15-May-1970, PCP Eustace Moore, MD.   Past Medical History:  Diagnosis Date  . Allergy    food  . Anemia   . Anxiety   . Arthritis    hips, rotator cuff, hands  . Asthma   . Carpal tunnel syndrome of right wrist   . Colitis Nov 2013  . Depression   . Diverticulosis   . GERD (gastroesophageal reflux disease)   . Hernia, hiatal   . Hypertension   . Thyroid disease    Past Surgical History:  Procedure Laterality Date  . COLONOSCOPY WITH ESOPHAGOGASTRODUODENOSCOPY (EGD) N/A 04/11/2012   Procedure: COLONOSCOPY WITH ESOPHAGOGASTRODUODENOSCOPY (EGD);  Surgeon: West Bali, MD;  Location: [facility];  Service: Endoscopy;  Laterality: N/A;  8:30  . WISDOM TOOTH EXTRACTION  1994   Social History   Social History  . Marital status: Married    Spouse name: Kenneth-"Keith"  . Number of children: 2  . Years of education: 12   Occupational History  . Archivist   Social History Main Topics  . Smoking status: Current Every Day Smoker    Packs/day: 1.00    Years: 22.00    Types: Cigarettes    Start date: 02/20/1988  . Smokeless tobacco: Never Used  . Alcohol use Yes     Comment: occasionally 3 times per year, HX heavy etoh x 2 yrs (20's)  . Drug use: Yes    Types: Marijuana     Comment: pt reports using a friend's pain med at times for severe pain-Past marijuana last couple weeks  . Sexual activity: Yes    Birth control/ protection: None, Post-menopausal   Other Topics Concern  . None   Social History Narrative   Lives with Debra Barnett - husband of 24 years   Daughter lives with a friend   Lives with Keith's dad-had a stroke and is disabled, difficult   Debra Barnett does not work - not disabled- turned down 4 times- depression   Outpatient Encounter Prescriptions as of 10/16/2016  Medication Sig  . calcium carbonate (TUMS - DOSED IN MG ELEMENTAL CALCIUM) 500 MG chewable tablet Chew 2-3 tablets  by mouth daily as needed. For indigestion  . citalopram (CELEXA) 20 MG tablet Take 1 tablet (20 mg total) by mouth daily.  Marland Kitchen ibuprofen (ADVIL,MOTRIN) 800 MG tablet Take 1 tablet (800 mg total) by mouth 3 (three) times daily.  Marland Kitchen lisinopril (PRINIVIL,ZESTRIL) 10 MG tablet Take 1 tablet (10 mg total) by mouth daily.  . Melatonin 5 MG TABS Take 15 mg by mouth at bedtime.  Marland Kitchen omeprazole (PRILOSEC) 40 MG capsule Take 1 capsule (40 mg total) by mouth daily.  . propranolol (INDERAL) 20 MG tablet Take 1 tablet (20 mg total) by mouth 2 (two) times daily.   No facility-administered encounter medications on file as of 10/16/2016.     ALLERGIES: Allergies  Allergen Reactions  . Dairy Aid [Lactase] Shortness Of Breath  . Adhesive [Tape] Other (See Comments)    Redness, small rash   . Sulfa Antibiotics Itching    VACCINATION STATUS: Immunization History  Administered Date(s) Administered  . Tdap 11/24/2011     HPI  Debra Barnett is 46 y.o. female who presents today with a medical history as above. she is being seen in consultation for hyperthyroidism requested by Eustace Moore, MD.  she has been dealing with symptoms  of  Weight loss of 45 pounds over 5 months, palpitations, anxiety/irritability, sleep disturbance,  and heat intolerance for 4-5 months. she denies dysphagia, choking, shortness of breath, not recent voice change. These symptoms are progressively worsening and troubling to her.   she has family history of thyroid dysfunction in her mother who has hypothyroidism currently on thyroid hormone replacement, but denies family hx of thyroid cancer. she denies personal history of goiter. she is not on any anti-thyroid medications nor on any thyroid hormone supplements. -On 09/11/2016 she underwent lab work which showed total T3 significantly elevated at 640 and total T4 also significantly elevated at 23.8 associated with suppressed TSH of less than 0.01.   she  is willing to proceed with  appropriate work up and therapy for thyrotoxicosis.   Constitutional: + weight loss, + fatigue, + subjective hyperthermia Eyes: no blurry vision, + xerophthalmia ENT: no sore throat, no nodules palpated in throat, no dysphagia/odynophagia, nor hoarseness Cardiovascular: no Chest Pain, no Shortness of Breath, +  palpitations, no leg swelling Respiratory: no cough, no SOB Gastrointestinal: no Nausea, no Vomiting, no Diarhhea Musculoskeletal: no muscle/joint aches Skin: no rashes Neurological: +  tremors, no numbness, no tingling, no dizziness Psychiatric: no depression, +  anxiety   Objective:    BP 114/74   Pulse 77   Ht 5\' 5"  (1.651 m)   Wt 111 lb (50.3 kg)   BMI 18.47 kg/m   Wt Readings from Last 3 Encounters:  10/16/16 111 lb (50.3 kg)  09/11/16 116 lb 0.6 oz (52.6 kg)  07/07/15 155 lb (70.3 kg)     Constitutional: + Appropriate weight for height, not in acute distress, +anxious state of mind Eyes: PERRLA, EOMI, - exophthalmos ENT: moist mucous membranes, +  thyromegaly, no cervical lymphadenopathy Cardiovascular: + Active precordial activity, + normal Rate and Rhythm, no Murmur/Rubs/Gallops Respiratory:  adequate breathing efforts, no gross chest deformity, Clear to auscultation bilaterally Gastrointestinal: abdomen soft, Non -tender, No distension, Bowel Sounds present Musculoskeletal: no gross deformities, strength intact in all four extremities Skin: moist, warm, no rashes Neurological: +   tremor with outstretched hands,  +brisk Deep Tendon Reflexes  on both lower extremities.   CMP     Component Value Date/Time   NA 137 09/11/2016 1029   K 5.0 09/11/2016 1029   CL 103 09/11/2016 1029   CO2 23 09/11/2016 1029   GLUCOSE 105 (H) 09/11/2016 1029   BUN 9 09/11/2016 1029   CREATININE 0.50 09/11/2016 1029   CALCIUM 10.0 09/11/2016 1029   PROT 7.0 09/11/2016 1029   ALBUMIN 4.3 09/11/2016 1029   AST 22 09/11/2016 1029   ALT 27 09/11/2016 1029   ALKPHOS 178 (H)  09/11/2016 1029   BILITOT 0.4 09/11/2016 1029   GFRNONAA >89 09/11/2016 1029   GFRAA >89 09/11/2016 1029     CBC    Component Value Date/Time   WBC 8.5 09/11/2016 1029   RBC 5.20 (H) 09/11/2016 1029   HGB 14.5 09/11/2016 1029   HCT 42.8 09/11/2016 1029   PLT 384 09/11/2016 1029   MCV 82.3 09/11/2016 1029   MCH 27.9 09/11/2016 1029   MCHC 33.9 09/11/2016 1029   RDW 13.1 09/11/2016 1029   LYMPHSABS 3.4 08/28/2012 1658   MONOABS 1.0 08/28/2012 1658   EOSABS 0.1 08/28/2012 1658   BASOSABS 0.0 08/28/2012 1658     Diabetic Labs (most recent): No results found for: HGBA1C  Lipid Panel     Component Value Date/Time   CHOL 109 09/11/2016  1029   TRIG 81 09/11/2016 1029   HDL 40 (L) 09/11/2016 1029   CHOLHDL 2.7 09/11/2016 1029   VLDL 16 09/11/2016 1029   LDLCALC 53 09/11/2016 1029   Results for KATIEANN, HUNGATE (MRN [id number]) as of 10/16/2016 08:49  Ref. Range 09/11/2016 10:29  TSH Latest Units: mIU/L <0.01 (L)  Triiodothyronine (T3) Latest Ref Range: 76 - 181 ng/dL 782.9 (H)  Thyroxine (T4) Latest Ref Range: 4.5 - 12.0 ug/dL 56.2 (H)     Assessment & Plan:   1. Hyperthyroidism   she is being seen at a kind request of Eustace Moore, MD. her history and most recent labs are reviewed, and she was examined clinically. Subjective and objective findings are consistent with thyrotoxicosis likely from primary hyperthyroidism.  The potential risks of untreated thyrotoxicosis and the need for definitive therapy have been discussed in detail with her, and she agrees to proceed with diagnostic workup and treatment plan.   I like to obtain confirmatory thyroid uptake and scan which  will be scheduled to be done as soon as possible.   Options of therapy are discussed with her. Definitive therapy may involve RAI ablation of the thyroid, with subsequent need for lifelong thyroid hormone replacement. she is made aware of this outcome  and she is  willing to proceed. she will return  in 1 week for treatment decision.   I will initiate a temporary prescription for  Propranolol  20 mg po BID for symptomatic relief.  - Time spent with the patient: 1 hour, of which >50% was spent in obtaining information about her symptoms, reviewing her current and previous labs, evaluations, and treatments, counseling her about hyperthyroidism and its complications , and developing a plan for long term treatment; she had a number of questions which I addressed.  - I advised her to maintain close follow up with Delton See Letta Pate, MD for primary care needs.  Follow up plan: Return in about 1 week (around 10/23/2016) for follow up with thyroid uptake and scan.   Thank you for involving me in the care of this pleasant patient, and I will continue to update you with her progress.  Marquis Lunch, MD [facility] Phone: [phone]  Fax: [phone]   10/16/2016, 9:30 AM  This note was partially dictated with voice recognition software. Similar sounding words can be transcribed inadequately or may not  be corrected upon review.

## 2016-10-17 ENCOUNTER — Ambulatory Visit (INDEPENDENT_AMBULATORY_CARE_PROVIDER_SITE_OTHER): Payer: 59 | Admitting: Family Medicine

## 2016-10-17 ENCOUNTER — Encounter: Payer: Self-pay | Admitting: Family Medicine

## 2016-10-17 VITALS — BP 120/72 | HR 76 | Temp 98.2°F | Resp 18 | Ht 65.0 in | Wt 111.1 lb

## 2016-10-17 DIAGNOSIS — E0590 Thyrotoxicosis, unspecified without thyrotoxic crisis or storm: Secondary | ICD-10-CM

## 2016-10-17 DIAGNOSIS — E739 Lactose intolerance, unspecified: Secondary | ICD-10-CM

## 2016-10-17 DIAGNOSIS — F411 Generalized anxiety disorder: Secondary | ICD-10-CM | POA: Diagnosis not present

## 2016-10-17 DIAGNOSIS — Z720 Tobacco use: Secondary | ICD-10-CM

## 2016-10-17 NOTE — Patient Instructions (Addendum)
Avoid lactose and dairy products Try zyrtec daily No change in medicine Come back for a PAP and PE - When you are able

## 2016-10-17 NOTE — Progress Notes (Signed)
Chief Complaint  Patient presents with  . Follow-up   Saw Dr Fransico Him for her thyroid.  He is scheduling her for additional assessment and treatment She is still very tired, and losing eight.  A  lot of stress at home and work.  Some anxiety. Still taking celexa and feels like it helps. Has reduced her smoking by half and is trying to quit She complains that she feel her lactose intolerance is worse.  Cannot even tolerate small amounts of lactose.  She has coughing spells and a lot of mucous.  Will be upcoughing at night.  I advised her the smoking doe snot help.  She can try a daily antihistamine to reduce secretions.  Patient Active Problem List   Diagnosis Date Noted  . Lactose intolerance 10/17/2016  . Hyperthyroidism 10/16/2016  . Diverticulosis 09/11/2016  . GAD (generalized anxiety disorder) 09/11/2016  . Chronic neck pain 09/11/2016  . Tobacco abuse 09/11/2016  . GERD (gastroesophageal reflux disease) 03/27/2012  . Chronic nausea 03/27/2012    Outpatient Encounter Prescriptions as of 10/17/2016  Medication Sig  . calcium carbonate (TUMS - DOSED IN MG ELEMENTAL CALCIUM) 500 MG chewable tablet Chew 2-3 tablets by mouth daily as needed. For indigestion  . citalopram (CELEXA) 20 MG tablet Take 1 tablet (20 mg total) by mouth daily.  Marland Kitchen ibuprofen (ADVIL,MOTRIN) 800 MG tablet Take 1 tablet (800 mg total) by mouth 3 (three) times daily.  Marland Kitchen lisinopril (PRINIVIL,ZESTRIL) 10 MG tablet Take 1 tablet (10 mg total) by mouth daily.  . Melatonin 5 MG TABS Take 15 mg by mouth at bedtime.  Marland Kitchen omeprazole (PRILOSEC) 40 MG capsule Take 1 capsule (40 mg total) by mouth daily.  . propranolol (INDERAL) 20 MG tablet Take 1 tablet (20 mg total) by mouth 2 (two) times daily.   No facility-administered encounter medications on file as of 10/17/2016.     Allergies  Allergen Reactions  . Adhesive [Tape] Other (See Comments)    Redness, small rash   . Sulfa Antibiotics Itching    Review of Systems    Constitutional: Positive for fatigue and unexpected weight change. Negative for activity change and appetite change.  HENT: Negative for congestion, dental problem, postnasal drip and rhinorrhea.   Eyes: Negative for redness and visual disturbance.  Respiratory: Positive for cough. Negative for shortness of breath.   Cardiovascular: Negative for chest pain, palpitations and leg swelling.  Gastrointestinal: Positive for diarrhea. Negative for abdominal pain and constipation.  Genitourinary: Negative for difficulty urinating, frequency and menstrual problem.  Musculoskeletal: Negative for arthralgias and back pain.  Neurological: Negative for dizziness and headaches.  Psychiatric/Behavioral: Negative for dysphoric mood and sleep disturbance. The patient is nervous/anxious.     BP 120/72 (BP Location: Right Arm, Patient Position: Sitting, Cuff Size: Normal)   Pulse 76   Temp 98.2 F (36.8 C) (Temporal)   Resp 18   Ht 5\' 5"  (1.651 m)   Wt 111 lb 1.3 oz (50.4 kg)   SpO2 98%   BMI 18.48 kg/m   Physical Exam  Constitutional: She is oriented to person, place, and time. She appears well-developed and well-nourished.  Thin.  anxoius  HENT:  Head: Normocephalic and atraumatic.  Mouth/Throat: Oropharynx is clear and moist.  Eyes: Pupils are equal, round, and reactive to light. Conjunctivae are normal.  Neck: Normal range of motion. Neck supple. Thyromegaly present.  Toward R, nontender  Cardiovascular: Normal rate, regular rhythm and normal heart sounds.   Pulmonary/Chest: Effort normal and  breath sounds normal. No respiratory distress.  Abdominal: Soft. Bowel sounds are normal.  Musculoskeletal: Normal range of motion. She exhibits no edema.  Lymphadenopathy:    She has no cervical adenopathy.  Neurological: She is alert and oriented to person, place, and time.  Gait normal  Skin: Skin is warm and dry.  Psychiatric: She has a normal mood and affect. Her behavior is normal. Thought  content normal.  Nursing note and vitals reviewed.   ASSESSMENT/PLAN:  1. Lactose intolerance Avoidance recommended 2.hyperthyroidism 3. Tobacco abuse 4. GAD   Patient Instructions  Avoid lactose and dairy products Try zyrtec daily No change in medicine Come back for a PAP and PE - When you are able   Eustace Moore, MD

## 2016-10-17 NOTE — Progress Notes (Signed)
   Subjective:    Patient ID: Debra Barnett, female    DOB: 1970-03-28, 46 y.o.   MRN: [id number]  HPI    Review of Systems     Objective:   Physical Exam        Assessment & Plan:

## 2016-10-19 ENCOUNTER — Telehealth: Payer: Self-pay

## 2016-10-19 NOTE — Telephone Encounter (Signed)
[facility] and said that [facility] is required for uptake scan.  She said that we were told incorrect that auth was not required.  It is required and they will need clinicals to auth this test.  Please call with ref # [id number].  Phone # to call is [phone]

## 2016-10-19 NOTE — Telephone Encounter (Signed)
PA # [id number]  Vaild for 45 days exp. On 12-03-2016

## 2016-10-23 ENCOUNTER — Encounter (HOSPITAL_COMMUNITY)
Admission: RE | Admit: 2016-10-23 | Discharge: 2016-10-23 | Disposition: A | Discharge status: Home / Self Care | Payer: 59 | Source: Ambulatory Visit | Attending: "Endocrinology | Admitting: "Endocrinology

## 2016-10-23 ENCOUNTER — Ambulatory Visit: Payer: 59 | Admitting: "Endocrinology

## 2016-10-23 ENCOUNTER — Encounter (HOSPITAL_COMMUNITY): Payer: Self-pay

## 2016-10-23 DIAGNOSIS — E0590 Thyrotoxicosis, unspecified without thyrotoxic crisis or storm: Secondary | ICD-10-CM | POA: Insufficient documentation

## 2016-10-23 MED ORDER — SODIUM IODIDE I 131 CAPSULE
14.0000 | Freq: Once | INTRAVENOUS | Status: AC | PRN
  Administered 2016-10-23: 14 via ORAL

## 2016-10-24 ENCOUNTER — Encounter (HOSPITAL_COMMUNITY)
Admission: RE | Admit: 2016-10-24 | Discharge: 2016-10-24 | Disposition: A | Discharge status: Home / Self Care | Payer: 59 | Source: Ambulatory Visit | Attending: "Endocrinology | Admitting: "Endocrinology

## 2016-10-24 DIAGNOSIS — E0590 Thyrotoxicosis, unspecified without thyrotoxic crisis or storm: Secondary | ICD-10-CM | POA: Diagnosis present

## 2016-10-24 MED ORDER — SODIUM PERTECHNETATE TC 99M INJECTION
10.0000 | Freq: Once | INTRAVENOUS | Status: AC | PRN
  Administered 2016-10-24: 10 via INTRAVENOUS

## 2016-10-29 ENCOUNTER — Encounter: Payer: Self-pay | Admitting: "Endocrinology

## 2016-10-29 ENCOUNTER — Ambulatory Visit (INDEPENDENT_AMBULATORY_CARE_PROVIDER_SITE_OTHER): Payer: 59 | Admitting: "Endocrinology

## 2016-10-29 VITALS — BP 126/83 | HR 97 | Ht 65.0 in | Wt 112.0 lb

## 2016-10-29 DIAGNOSIS — E0500 Thyrotoxicosis with diffuse goiter without thyrotoxic crisis or storm: Secondary | ICD-10-CM | POA: Diagnosis not present

## 2016-10-29 DIAGNOSIS — E0590 Thyrotoxicosis, unspecified without thyrotoxic crisis or storm: Secondary | ICD-10-CM | POA: Diagnosis not present

## 2016-10-29 NOTE — Progress Notes (Signed)
Subjective:    Patient ID: Debra Barnett, female    DOB: 1970-10-09, PCP Debra MooreNelson, [name], MD.   Past Medical History:  Diagnosis Date  . Allergy    food  . Anemia   . Anxiety   . Arthritis    hips, rotator cuff, hands  . Asthma   . Carpal tunnel syndrome of right wrist   . Colitis Nov 2013  . Depression   . Diverticulosis   . GERD (gastroesophageal reflux disease)   . Hernia, hiatal   . Hypertension   . Thyroid disease    Past Surgical History:  Procedure Laterality Date  . COLONOSCOPY WITH ESOPHAGOGASTRODUODENOSCOPY (EGD) N/A 04/11/2012   Procedure: COLONOSCOPY WITH ESOPHAGOGASTRODUODENOSCOPY (EGD);  Surgeon: West BaliSandi L Fields, MD;  Location: [facility];  Service: Endoscopy;  Laterality: N/A;  8:30  . WISDOM TOOTH EXTRACTION  1994   Social History   Social History  . Marital status: Married    Spouse name: Kenneth-"Keith"  . Number of children: 2  . Years of education: 12   Occupational History  . Archivistcashier     [employer]   Social History Main Topics  . Smoking status: Current Every Day Smoker    Packs/day: 1.00    Years: 22.00    Types: Cigarettes    Start date: 02/20/1988  . Smokeless tobacco: Never Used  . Alcohol use Yes     Comment: occasionally 3 times per year, HX heavy etoh x 2 yrs (20's)  . Drug use: Yes    Types: Marijuana     Comment: pt reports using a friend's pain med at times for severe pain-Past marijuana last couple weeks  . Sexual activity: Yes    Birth control/ protection: None, Post-menopausal   Other Topics Concern  . None   Social History Narrative   Lives with Mellody DanceKeith - husband of 24 years   Daughter lives with a friend   Lives with Keith's dad-had a stroke and is disabled, difficult   Mellody DanceKeith does not work - not disabled- turned down 4 times- depression   Outpatient Encounter Prescriptions as of 10/29/2016  Medication Sig  . calcium carbonate (TUMS - DOSED IN MG ELEMENTAL CALCIUM) 500 MG chewable tablet Chew 2-3 tablets  by mouth daily as needed. For indigestion  . citalopram (CELEXA) 20 MG tablet Take 1 tablet (20 mg total) by mouth daily.  Marland Kitchen. ibuprofen (ADVIL,MOTRIN) 800 MG tablet Take 1 tablet (800 mg total) by mouth 3 (three) times daily.  Marland Kitchen. lisinopril (PRINIVIL,ZESTRIL) 10 MG tablet Take 1 tablet (10 mg total) by mouth daily.  . Melatonin 5 MG TABS Take 15 mg by mouth at bedtime.  Marland Kitchen. omeprazole (PRILOSEC) 40 MG capsule Take 1 capsule (40 mg total) by mouth daily.  . propranolol (INDERAL) 20 MG tablet Take 1 tablet (20 mg total) by mouth 2 (two) times daily.   No facility-administered encounter medications on file as of 10/29/2016.     ALLERGIES: Allergies  Allergen Reactions  . Adhesive [Tape] Other (See Comments)    Redness, small rash   . Sulfa Antibiotics Itching    VACCINATION STATUS: Immunization History  Administered Date(s) Administered  . Tdap 11/24/2011     HPI  Debra Barnett is 46 y.o. female who presents today with a medical history as above. she is here to follow-up for hyperthyroidism, now confirm to be secondary to Graves' disease.  she has been dealing with symptoms of  Weight loss of 45 pounds  over 5 months, palpitations, anxiety/irritability, sleep disturbance,  and heat intolerance for 4-5 months. she denies dysphagia, choking, shortness of breath, not recent voice change. These symptoms are progressively worsening and troubling to her.   she has family history of thyroid dysfunction in her mother who has hypothyroidism currently on thyroid hormone replacement, but denies family hx of thyroid cancer. she denies personal history of goiter. she is not on any anti-thyroid medications nor on any thyroid hormone supplements.  -On 09/11/2016 she underwent lab work which showed total T3 significantly elevated at 640 and total T4 also significantly elevated at 23.8 associated with suppressed TSH of less than 0.01.   she  is willing to proceed with appropriate work up and therapy for  thyrotoxicosis.   Constitutional: + weight loss, + fatigue, + subjective hyperthermia Eyes: no blurry vision, + xerophthalmia ENT: no sore throat, no nodules palpated in throat, no dysphagia/odynophagia, nor hoarseness Cardiovascular: no Chest Pain, no Shortness of Breath, +  palpitations, no leg swelling Respiratory: no cough, no SOB Gastrointestinal: no Nausea, no Vomiting, no Diarhhea Musculoskeletal: no muscle/joint aches Skin: no rashes Neurological: +  tremors, no numbness, no tingling, no dizziness Psychiatric: no depression, +  anxiety   Objective:    BP 126/83   Pulse 97   Ht  (1.651 m)   Wt 112 lb (50.8 kg)   BMI 18.64 kg/m   Wt Readings from Last 3 Encounters:  10/29/16 112 lb (50.8 kg)  10/17/16 111 lb 1.3 oz (50.4 kg)  10/16/16 111 lb (50.3 kg)     Constitutional: + Appropriate weight for height, not in acute distress, +anxious state of mind Eyes: PERRLA, EOMI, - exophthalmos ENT: moist mucous membranes, +  thyromegaly, no cervical lymphadenopathy Cardiovascular: + Active precordial activity, + normal Rate and Rhythm, no Murmur/Rubs/Gallops Respiratory:  adequate breathing efforts, no gross chest deformity, Clear to auscultation bilaterally Gastrointestinal: abdomen soft, Non -tender, No distension, Bowel Sounds present Musculoskeletal: no gross deformities, strength intact in all four extremities Skin: moist, warm, no rashes Neurological: +   tremor with outstretched hands,  +brisk Deep Tendon Reflexes  on both lower extremities.   CMP     Component Value Date/Time   NA 137 09/11/2016 1029   K 5.0 09/11/2016 1029   CL 103 09/11/2016 1029   CO2 23 09/11/2016 1029   GLUCOSE 105 (H) 09/11/2016 1029   BUN 9 09/11/2016 1029   CREATININE 0.50 09/11/2016 1029   CALCIUM 10.0 09/11/2016 1029   PROT 7.0 09/11/2016 1029   ALBUMIN 4.3 09/11/2016 1029   AST 22 09/11/2016 1029   ALT 27 09/11/2016 1029   ALKPHOS 178 (H) 09/11/2016 1029   BILITOT 0.4  09/11/2016 1029   GFRNONAA >89 09/11/2016 1029   GFRAA >89 09/11/2016 1029     CBC    Component Value Date/Time   WBC 8.5 09/11/2016 1029   RBC 5.20 (H) 09/11/2016 1029   HGB 14.5 09/11/2016 1029   HCT 42.8 09/11/2016 1029   PLT 384 09/11/2016 1029   MCV 82.3 09/11/2016 1029   MCH 27.9 09/11/2016 1029   MCHC 33.9 09/11/2016 1029   RDW 13.1 09/11/2016 1029   LYMPHSABS 3.4 08/28/2012 1658   MONOABS 1.0 08/28/2012 1658   EOSABS 0.1 08/28/2012 1658   BASOSABS 0.0 08/28/2012 1658   Lipid Panel     Component Value Date/Time   CHOL 109 09/11/2016 1029   TRIG 81 09/11/2016 1029   HDL 40 (L) 09/11/2016 1029   CHOLHDL 2.7  09/11/2016 1029   VLDL 16 09/11/2016 1029   LDLCALC 53 09/11/2016 1029   Results for SHAQUNA, GEIGLE (MRN [id number]) as of 10/16/2016 08:49  Ref. Range 09/11/2016 10:29  TSH Latest Units: mIU/L <0.01 (L)  Triiodothyronine (T3) Latest Ref Range: 76 - 181 ng/dL 409.8 (H)  Thyroxine (T4) Latest Ref Range: 4.5 - 12.0 ug/dL 11.9 (H)   Thyroid uptake and scan on 10/24/2016 shows uniform uptake of 63% in 24 hours consistent with Graves' disease.  Assessment & Plan:   1. Hyperthyroidism  2. Grave's Disease - Her thyroid uptake and scan confirms uniform uptake of 63% consistent with Graves' disease. - Options of therapy discussed with her and she agrees with my recommendation of I-131 thyroid ablation. She understands the subsequent need for thyroid hormone replacement. -This treatment will be scheduled to be administered as soon as possible at [facility].   I will continue the temporary treatment with low-dose   Propranolol  20 mg po BID for symptomatic relief.  - I advised her to maintain close follow up with Delton See Letta Pate, MD for primary care needs.  Follow up plan: Return in about 9 weeks (around 12/31/2016) for follow up with pre-visit labs. Thank you for involving me in the care of this pleasant patient, and I will continue to  update you with her progress.  Marquis Lunch, MD [facility] Phone: [phone]  Fax: [phone]   10/29/2016, 3:20 PM  This note was partially dictated with voice recognition software. Similar sounding words can be transcribed inadequately or may not  be corrected upon review.

## 2016-11-09 ENCOUNTER — Encounter (HOSPITAL_COMMUNITY)
Admission: RE | Admit: 2016-11-09 | Discharge: 2016-11-09 | Disposition: A | Discharge status: Home / Self Care | Payer: 59 | Source: Ambulatory Visit | Attending: "Endocrinology | Admitting: "Endocrinology

## 2016-11-09 ENCOUNTER — Encounter (HOSPITAL_COMMUNITY): Payer: Self-pay

## 2016-11-09 DIAGNOSIS — E0590 Thyrotoxicosis, unspecified without thyrotoxic crisis or storm: Secondary | ICD-10-CM | POA: Diagnosis present

## 2016-11-09 LAB — I-STAT BETA HCG BLOOD, ED (NOT ORDERABLE): I-stat hCG, quantitative: <5 m[IU]/mL (ref <5)

## 2016-11-09 MED ORDER — SODIUM IODIDE I 131 CAPSULE
15.0000 | Freq: Once | INTRAVENOUS | Status: AC | PRN
  Administered 2016-11-09: 15 via ORAL

## 2016-11-14 ENCOUNTER — Emergency Department (HOSPITAL_COMMUNITY)
Admission: EM | Admit: 2016-11-14 | Discharge: 2016-11-14 | Disposition: A | Discharge status: Home / Self Care | Payer: 59 | Attending: Emergency Medicine | Admitting: Emergency Medicine

## 2016-11-14 ENCOUNTER — Ambulatory Visit (HOSPITAL_COMMUNITY)
Admit: 2016-11-14 | Discharge: 2016-11-14 | Disposition: A | Discharge status: Home / Self Care | Payer: 59 | Attending: Emergency Medicine | Admitting: Emergency Medicine

## 2016-11-14 ENCOUNTER — Encounter (HOSPITAL_COMMUNITY): Payer: Self-pay | Admitting: Emergency Medicine

## 2016-11-14 ENCOUNTER — Ambulatory Visit (HOSPITAL_COMMUNITY)
Admission: EM | Admit: 2016-11-14 | Discharge: 2016-11-14 | Disposition: A | Discharge status: Home / Self Care | Payer: 59 | Source: Ambulatory Visit | Attending: Emergency Medicine | Admitting: Emergency Medicine

## 2016-11-14 DIAGNOSIS — R1114 Bilious vomiting: Secondary | ICD-10-CM | POA: Diagnosis not present

## 2016-11-14 DIAGNOSIS — Z79899 Other long term (current) drug therapy: Secondary | ICD-10-CM | POA: Insufficient documentation

## 2016-11-14 DIAGNOSIS — R1030 Lower abdominal pain, unspecified: Secondary | ICD-10-CM | POA: Insufficient documentation

## 2016-11-14 DIAGNOSIS — D72829 Elevated white blood cell count, unspecified: Secondary | ICD-10-CM | POA: Insufficient documentation

## 2016-11-14 DIAGNOSIS — J45909 Unspecified asthma, uncomplicated: Secondary | ICD-10-CM | POA: Insufficient documentation

## 2016-11-14 DIAGNOSIS — R112 Nausea with vomiting, unspecified: Secondary | ICD-10-CM | POA: Diagnosis present

## 2016-11-14 DIAGNOSIS — F17210 Nicotine dependence, cigarettes, uncomplicated: Secondary | ICD-10-CM | POA: Insufficient documentation

## 2016-11-14 DIAGNOSIS — R509 Fever, unspecified: Secondary | ICD-10-CM

## 2016-11-14 DIAGNOSIS — I10 Essential (primary) hypertension: Secondary | ICD-10-CM | POA: Diagnosis not present

## 2016-11-14 LAB — CBC WITH DIFFERENTIAL/PLATELET
Basophils Absolute: 0 10*3/uL (ref 0.0–0.1)
Basophils Relative: 0 %
EOS ABS: 0.1 10*3/uL (ref 0.0–0.7)
Eosinophils Relative: 1 %
HCT: 40.7 % (ref 36.0–46.0)
HEMOGLOBIN: 13.6 g/dL (ref 12.0–15.0)
LYMPHS ABS: 2.8 10*3/uL (ref 0.7–4.0)
LYMPHS PCT: 17 %
MCH: 27.6 pg (ref 26.0–34.0)
MCHC: 33.4 g/dL (ref 30.0–36.0)
MCV: 82.7 fL (ref 78.0–100.0)
MONOS PCT: 10 %
Monocytes Absolute: 1.6 10*3/uL — ABNORMAL HIGH (ref 0.1–1.0)
NEUTROS PCT: 72 %
Neutro Abs: 11.6 10*3/uL — ABNORMAL HIGH (ref 1.7–7.7)
Platelets: 237 10*3/uL (ref 150–400)
RBC: 4.92 MIL/uL (ref 3.87–5.11)
RDW: 13.7 % (ref 11.5–15.5)
WBC: 16.1 10*3/uL — ABNORMAL HIGH (ref 4.0–10.5)

## 2016-11-14 LAB — COMPREHENSIVE METABOLIC PANEL
ALBUMIN: 3.9 g/dL (ref 3.5–5.0)
ALT: 30 U/L (ref 14–54)
AST: 33 U/L (ref 15–41)
Alkaline Phosphatase: 180 U/L — ABNORMAL HIGH (ref 38–126)
Anion gap: 13 (ref 5–15)
BUN: 8 mg/dL (ref 6–20)
CHLORIDE: 102 mmol/L (ref 101–111)
CO2: 23 mmol/L (ref 22–32)
CREATININE: 0.45 mg/dL (ref 0.44–1.00)
Calcium: 10.2 mg/dL (ref 8.9–10.3)
GFR calc Af Amer: >60 mL/min (ref >60)
GFR calc non Af Amer: >60 mL/min (ref >60)
Glucose, Bld: 147 mg/dL — ABNORMAL HIGH (ref 65–99)
Potassium: 4 mmol/L (ref 3.5–5.1)
SODIUM: 138 mmol/L (ref 135–145)
Total Bilirubin: 0.9 mg/dL (ref 0.3–1.2)
Total Protein: 7.4 g/dL (ref 6.5–8.1)

## 2016-11-14 LAB — POC URINE PREG, ED: PREG TEST UR: NEGATIVE

## 2016-11-14 LAB — URINALYSIS, ROUTINE W REFLEX MICROSCOPIC
BILIRUBIN URINE: NEGATIVE
Glucose, UA: NEGATIVE mg/dL
Hgb urine dipstick: NEGATIVE
Ketones, ur: 5 mg/dL — AB
Leukocytes, UA: NEGATIVE
NITRITE: NEGATIVE
PH: 8 (ref 5.0–8.0)
Protein, ur: NEGATIVE mg/dL
SPECIFIC GRAVITY, URINE: 1.015 (ref 1.005–1.030)

## 2016-11-14 LAB — LIPASE, BLOOD: LIPASE: 25 U/L (ref 11–51)

## 2016-11-14 MED ORDER — PROMETHAZINE HCL 25 MG PO TABS: 25.0000 mg | ORAL_TABLET | Freq: Four times a day (QID) | ORAL | 0 refills | Status: DC | PRN

## 2016-11-14 MED ORDER — CIPROFLOXACIN HCL 500 MG PO TABS: 500.0000 mg | ORAL_TABLET | Freq: Two times a day (BID) | ORAL | 0 refills | Status: DC

## 2016-11-14 MED ORDER — ONDANSETRON HCL 4 MG/2ML IJ SOLN
4.0000 mg | Freq: Once | INTRAMUSCULAR | Status: AC | PRN
  Administered 2016-11-14: 4 mg via INTRAVENOUS
  Filled 2016-11-14: qty 2

## 2016-11-14 MED ORDER — IOPAMIDOL (ISOVUE-300) INJECTION 61%
INTRAVENOUS | Status: AC
  Filled 2016-11-14: qty 30

## 2016-11-14 MED ORDER — SODIUM CHLORIDE 0.9 % IV BOLUS (SEPSIS)
1000.0000 mL | Freq: Once | INTRAVENOUS | Status: AC
  Administered 2016-11-14: 1000 mL via INTRAVENOUS

## 2016-11-14 MED ORDER — MORPHINE SULFATE (PF) 4 MG/ML IV SOLN
4.0000 mg | Freq: Once | INTRAVENOUS | Status: AC
  Administered 2016-11-14: 4 mg via INTRAVENOUS
  Filled 2016-11-14: qty 1

## 2016-11-14 MED ORDER — METRONIDAZOLE 500 MG PO TABS: 500.0000 mg | ORAL_TABLET | Freq: Two times a day (BID) | ORAL | 0 refills | Status: DC

## 2016-11-14 MED ORDER — ONDANSETRON HCL 4 MG/2ML IJ SOLN
4.0000 mg | Freq: Once | INTRAMUSCULAR | Status: AC
  Administered 2016-11-14: 4 mg via INTRAVENOUS
  Filled 2016-11-14: qty 2

## 2016-11-14 MED ORDER — IOPAMIDOL (ISOVUE-300) INJECTION 61%
100.0000 mL | Freq: Once | INTRAVENOUS | Status: AC | PRN
  Administered 2016-11-14: 100 mL via INTRAVENOUS

## 2016-11-14 MED ORDER — GI COCKTAIL ~~LOC~~
30.0000 mL | Freq: Once | ORAL | Status: AC
Start: 2016-11-14 — End: 2016-11-14
  Administered 2016-11-14: 30 mL via ORAL
  Filled 2016-11-14: qty 30

## 2016-11-14 NOTE — ED Notes (Signed)
Pt vomited times one.  C/o abdominal pain 7/10.   Orders received from Eugene PA

## 2016-11-14 NOTE — Discharge Instructions (Signed)
Follow-up with your primary doctor for recheck.  Bland diet as tolerated.  Return to ER for any worsening symptoms

## 2016-11-14 NOTE — ED Notes (Signed)
Pt resting with eyes shut.  Easily arouses.  Continues to c/o nausea.

## 2016-11-14 NOTE — ED Triage Notes (Signed)
Pt hx of gastritis. Cooked with grease last night. Woke up with n/v/d this am. Pt is dry heaving in triage. Pt moaning. C/o abd pain.

## 2016-11-14 NOTE — ED Provider Notes (Signed)
[facility] Provider Note   CSN: [id number] Arrival date & time: 11/14/16  7829     History   Chief Complaint Chief Complaint  Patient presents with  . Abdominal Pain  . Emesis    HPI Debra KENDRIX is a 46 y.o. female.  HPI   Debra Barnett is a 46 y.o. female who presents to the Emergency Department complaining of diffuse abdominal pain, nausea and vomiting.  Sudden onset of symptoms that began upon waking.  States that she cooked with grease on the evening prior to arrival.  Has hx of gastritis and diverticulitis and believes that the grease exacerbated her gastritis.  Complains of multiple episodes of vomiting.  Describes abdominal pain as sharp and constant.  Denies radiation of pain.  No diarrhea, hematemesis, fever, chest pain or back pain.  Nothing makes her symptoms better or worse.    Past Medical History:  Diagnosis Date  . Allergy    food  . Anemia   . Anxiety   . Arthritis    hips, rotator cuff, hands  . Asthma   . Carpal tunnel syndrome of right wrist   . Colitis Nov 2013  . Depression   . Diverticulosis   . GERD (gastroesophageal reflux disease)   . Hernia, hiatal   . Hypertension   . Thyroid disease     Patient Active Problem List   Diagnosis Date Noted  . Graves' disease 10/29/2016  . Lactose intolerance 10/17/2016  . Hyperthyroidism 10/16/2016  . Diverticulosis 09/11/2016  . GAD (generalized anxiety disorder) 09/11/2016  . Chronic neck pain 09/11/2016  . Tobacco abuse 09/11/2016  . GERD (gastroesophageal reflux disease) 03/27/2012  . Chronic nausea 03/27/2012    Past Surgical History:  Procedure Laterality Date  . COLONOSCOPY WITH ESOPHAGOGASTRODUODENOSCOPY (EGD) N/A 04/11/2012   Procedure: COLONOSCOPY WITH ESOPHAGOGASTRODUODENOSCOPY (EGD);  Surgeon: West Bali, MD;  Location: [facility];  Service: Endoscopy;  Laterality: N/A;  8:30  . WISDOM TOOTH EXTRACTION  1994    OB History    No data available       Home  Medications    Prior to Admission medications   Medication Sig Start Date End Date Taking? Authorizing Provider  calcium carbonate (TUMS - DOSED IN MG ELEMENTAL CALCIUM) 500 MG chewable tablet Chew 2-3 tablets by mouth daily as needed. For indigestion   Yes [provider]  citalopram (CELEXA) 20 MG tablet Take 1 tablet (20 mg total) by mouth daily. 09/11/16  Yes Eustace Moore, MD  lisinopril (PRINIVIL,ZESTRIL) 10 MG tablet Take 1 tablet (10 mg total) by mouth daily. 09/11/16  Yes Eustace Moore, MD  Melatonin 5 MG TABS Take 15 mg by mouth at bedtime.   Yes [provider]  omeprazole (PRILOSEC) 40 MG capsule Take 1 capsule (40 mg total) by mouth daily. 09/11/16  Yes Eustace Moore, MD  propranolol (INDERAL) 20 MG tablet Take 1 tablet (20 mg total) by mouth 2 (two) times daily. 10/16/16  Yes Roma Kayser, MD    Family History Family History  Problem Relation Age of Onset  . Thyroid disease Mother        hypo  . Depression Mother   . Stroke Father   . Diabetes Father   . Alcohol abuse Father   . Depression Father   . Drug abuse Father   . Hypertension Father   . Early death Father 14       diabetes/stroke  . COPD Maternal Grandmother   .  Alcohol abuse Maternal Grandfather   . COPD Maternal Grandfather   . Colon cancer Neg Hx   . Colon polyps Neg Hx     Social History Social History  Substance Use Topics  . Smoking status: Current Every Day Smoker    Packs/day: 1.00    Years: 22.00    Types: Cigarettes    Start date: 02/20/1988  . Smokeless tobacco: Never Used  . Alcohol use Yes     Comment: occasionally 3 times per year, HX heavy etoh x 2 yrs (20's)     Allergies   Adhesive [tape] and Sulfa antibiotics   Review of Systems Review of Systems  Constitutional: Negative for appetite change, chills and fever.  Respiratory: Negative for chest tightness and shortness of breath.   Cardiovascular: Negative for chest pain.    Gastrointestinal: Positive for abdominal pain, nausea and vomiting. Negative for blood in stool.  Genitourinary: Negative for decreased urine volume, difficulty urinating, dysuria and flank pain.  Musculoskeletal: Negative for back pain.  Skin: Negative for color change and rash.  Neurological: Negative for dizziness, weakness and numbness.  Hematological: Negative for adenopathy.  All other systems reviewed and are negative.    Physical Exam Updated Vital Signs BP (!) 141/84   Pulse 90   Temp 97.9 F (36.6 C) (Oral)   Resp (!) 24   SpO2 97%   Physical Exam  Constitutional: She is oriented to person, place, and time. She appears well-developed and well-nourished.  Patient is moaning and anxious.    HENT:  Head: Normocephalic and atraumatic.  Mouth/Throat: Oropharynx is clear and moist and mucous membranes are normal. Mucous membranes are not dry.  Eyes: Pupils are equal, round, and reactive to light. Conjunctivae are normal.  Neck: Normal range of motion. Neck supple.  Cardiovascular: Normal rate, regular rhythm, normal heart sounds and intact distal pulses.   No murmur heard. Pulmonary/Chest: Effort normal and breath sounds normal. No respiratory distress.  Abdominal: Soft. Bowel sounds are normal. She exhibits no distension and no mass. There is tenderness. There is no rigidity, no rebound and no guarding.  abd soft with diffuse ttp of the abdomen without guarding or rebound tenderness.   Musculoskeletal: Normal range of motion. She exhibits no edema.  Neurological: She is alert and oriented to person, place, and time. She exhibits normal muscle tone. Coordination normal.  Skin: Skin is warm and dry.  Nursing note and vitals reviewed.    ED Treatments / Results  Labs (all labs ordered are listed, but only abnormal results are displayed) Labs Reviewed  COMPREHENSIVE METABOLIC PANEL - Abnormal; Notable for the following:       Result Value   Glucose, Bld 147 (*)     Alkaline Phosphatase 180 (*)    All other components within normal limits  URINALYSIS, ROUTINE W REFLEX MICROSCOPIC - Abnormal; Notable for the following:    Ketones, ur 5 (*)    All other components within normal limits  CBC WITH DIFFERENTIAL/PLATELET - Abnormal; Notable for the following:    WBC 16.1 (*)    Neutro Abs 11.6 (*)    Monocytes Absolute 1.6 (*)    All other components within normal limits  LIPASE, BLOOD  POC URINE PREG, ED    EKG  EKG Interpretation None       Radiology Ct Abdomen Pelvis W Contrast  Result Date: 11/14/2016 CLINICAL DATA:  46 year old female with midline lower abdominal pain, increasing white count and fever. EXAM: CT ABDOMEN AND PELVIS  WITH CONTRAST TECHNIQUE: Multidetector CT imaging of the abdomen and pelvis was performed using the standard protocol following bolus administration of intravenous contrast. CONTRAST:  ISOVUE-300 IOPAMIDOL (ISOVUE-300) INJECTION 61% COMPARISON:  12/12/2011 FINDINGS: Lower chest: No acute abnormality. Hepatobiliary: No focal liver abnormality is seen. No gallstones, gallbladder wall thickening, or biliary dilatation. Pancreas: Unremarkable. No pancreatic ductal dilatation or surrounding inflammatory changes. Spleen: Normal in size without focal abnormality. Adrenals/Urinary Tract: Adrenal glands are unremarkable. Kidneys are normal, without renal calculi, focal lesion, or hydronephrosis. Bladder is unremarkable. Stomach/Bowel: Stomach is within normal limits. Hyperdense contents are noted within the gastric lumen. Appendix appears normal. Small bowel is unremarkable. Collapse descending and sigmoid colon limits evaluation in this region, but there is suggestion of mild bowel wall thickening the ascending and transverse colon appear unremarkable. No evidence for diverticulitis. Vascular/Lymphatic: Aortic atherosclerosis. No enlarged abdominal or pelvic lymph nodes. Reproductive: Uterus and bilateral adnexa are unremarkable.  Other: No abdominal wall hernia or abnormality. No abdominopelvic ascites. Musculoskeletal: No acute or significant osseous findings. IMPRESSION: 1. Mild bowel wall thickening of the descending and sigmoid colon. This is nonspecific but the findings are most suggestive of an infectious or inflammatory process, with ischemic etiologies felt much less likely. 2. Otherwise unremarkable evaluation of the abdomen and pelvis. Electronically Signed   By: Sande Brothers M.D.   On: 11/14/2016 15:15    Procedures Procedures (including critical care time)  Medications Ordered in ED Medications  gi cocktail (Maalox,Lidocaine,Donnatal) (not administered)  ondansetron (ZOFRAN) injection 4 mg (4 mg Intravenous Given 11/14/16 1021)  sodium chloride 0.9 % bolus 1,000 mL (0 mLs Intravenous Stopped 11/14/16 1138)  morphine 4 MG/ML injection 4 mg (4 mg Intravenous Given 11/14/16 1059)     Initial Impression / Assessment and Plan / ED Course  I have reviewed the triage vital signs and the nursing notes.  Pertinent labs & imaging results that were available during my care of the patient were reviewed by me and considered in my medical decision making (see chart for details).      Pt uncomfortable appearing.  Mucous membranes are moist.  Reports multiple episodes of vomiting, but only saliva in emesis bag.    1140  Pt now resting comfortably after morphine and anti-emetic.  Continues to c/o nausea, but pain improved.  Explained that CT is down, pt agrees to wait.   Pt has leukocytosis, abd pain.  No further vomiting.  She agrees to transport to [facility] for CT abd/pelvis.  Will return here for disposition.   On return, pt is resting comfortably, no further vomiting and tolerating oral fluids.  Non-toxic.  Will tx with cipro, flagyl and return precautions discussed.  Appears stable for d/c  Final Clinical Impressions(s) / ED Diagnoses   Final diagnoses:  Lower abdominal pain  Bilious vomiting with nausea    New  Prescriptions New Prescriptions   No medications on file     Pauline Aus, Cordelia Poche 11/16/16 2101    Samuel Jester, DO 11/17/16 2318

## 2016-12-09 ENCOUNTER — Other Ambulatory Visit: Payer: Self-pay | Admitting: "Endocrinology

## 2017-01-07 ENCOUNTER — Ambulatory Visit: Payer: 59 | Admitting: "Endocrinology

## 2017-01-07 ENCOUNTER — Other Ambulatory Visit: Payer: Self-pay | Admitting: Family Medicine

## 2017-01-07 NOTE — Telephone Encounter (Signed)
Seen [phone] 

## 2017-01-08 LAB — TSH: TSH: 5.53 mIU/L — ABNORMAL HIGH

## 2017-01-08 LAB — T4, FREE: Free T4: 0.4 ng/dL — ABNORMAL LOW (ref 0.8–1.8)

## 2017-01-16 ENCOUNTER — Ambulatory Visit (INDEPENDENT_AMBULATORY_CARE_PROVIDER_SITE_OTHER): Payer: 59 | Admitting: "Endocrinology

## 2017-01-16 ENCOUNTER — Encounter: Payer: Self-pay | Admitting: "Endocrinology

## 2017-01-16 VITALS — BP 148/88 | HR 70 | Ht 65.0 in | Wt 124.0 lb

## 2017-01-16 DIAGNOSIS — E890 Postprocedural hypothyroidism: Secondary | ICD-10-CM

## 2017-01-16 MED ORDER — LEVOTHYROXINE SODIUM 75 MCG PO TABS: 75.0000 ug | ORAL_TABLET | Freq: Every day | ORAL | 2 refills | Status: DC

## 2017-01-16 NOTE — Progress Notes (Signed)
Subjective:    Patient ID: Debra Barnett, female    DOB: 20-Jan-1971, PCP Eustace MooreNelson, Yvonne Sue, MD.   Past Medical History:  Diagnosis Date  . Allergy    food  . Anemia   . Anxiety   . Arthritis    hips, rotator cuff, hands  . Asthma   . Carpal tunnel syndrome of right wrist   . Colitis Nov 2013  . Depression   . Diverticulosis   . GERD (gastroesophageal reflux disease)   . Hernia, hiatal   . Hypertension   . Thyroid disease    Past Surgical History:  Procedure Laterality Date  . COLONOSCOPY WITH ESOPHAGOGASTRODUODENOSCOPY (EGD) N/A 04/11/2012   Procedure: COLONOSCOPY WITH ESOPHAGOGASTRODUODENOSCOPY (EGD);  Surgeon: West BaliSandi L Fields, MD;  Location: [facility];  Service: Endoscopy;  Laterality: N/A;  8:30  . WISDOM TOOTH EXTRACTION  1994   Social History   Socioeconomic History  . Marital status: Married    Spouse name: Kenneth-"Keith"  . Number of children: 2  . Years of education: 7112  . Highest education level: None  Social Needs  . Financial resource strain: None  . Food insecurity - worry: None  . Food insecurity - inability: None  . Transportation needs - medical: None  . Transportation needs - non-medical: None  Occupational History  . 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 Smoking status: Current Every Day Smoker    Packs/day: 1.00    Years: 22.00    Pack years: 22.00    Types: Cigarettes    Start date: 02/20/1988  . Smokeless tobacco: Never Used  Substance and Sexual Activity  . Alcohol use: Yes    Comment: occasionally 3 times per year, HX heavy etoh x 2 yrs (20's)  . Drug use: Yes    Types: Marijuana    Comment: pt reports using a friend's pain med at times for severe pain-Past marijuana last couple weeks  . Sexual activity: Yes    Birth control/protection: None, Post-menopausal  Other Topics Concern  . None  Social History Narrative   Lives with Mellody DanceKeith - husband of 24 years   Daughter lives with a friend   Lives  with Keith's dad-had a stroke and is disabled, difficult   Mellody DanceKeith does not work - not disabled- turned down 4 times- depression   Outpatient Encounter Medications as of 01/16/2017  Medication Sig  . calcium carbonate (TUMS - DOSED IN MG ELEMENTAL CALCIUM) 500 MG chewable tablet Chew 2-3 tablets by mouth daily as needed. For indigestion  . citalopram (CELEXA) 20 MG tablet TAKE 1 TABLET BY MOUTH EVERY DAY  . levothyroxine (SYNTHROID, LEVOTHROID) 75 MCG tablet Take 1 tablet (75 mcg total) by mouth daily before breakfast.  . lisinopril (PRINIVIL,ZESTRIL) 10 MG tablet Take 1 tablet (10 mg total) by mouth daily.  . Melatonin 5 MG TABS Take 15 mg by mouth at bedtime.  Marland Kitchen. omeprazole (PRILOSEC) 40 MG capsule Take 1 capsule (40 mg total) by mouth daily.  . promethazine (PHENERGAN) 25 MG tablet Take 1 tablet (25 mg total) by mouth every 6 (six) hours as needed for nausea or vomiting.  . [DISCONTINUED] ciprofloxacin (CIPRO) 500 MG tablet Take 1 tablet (500 mg total) by mouth 2 (two) times daily.  . [DISCONTINUED] metroNIDAZOLE (FLAGYL) 500 MG tablet Take 1 tablet (500 mg total) by mouth 2 (two) times daily.  . [DISCONTINUED] propranolol (INDERAL) 20 MG tablet TAKE 1 TABLET BY MOUTH TWICE A  DAY   No facility-administered encounter medications on file as of 01/16/2017.     ALLERGIES: Allergies  Allergen Reactions  . Adhesive [Tape] Other (See Comments)    Redness, small rash   . Sulfa Antibiotics Itching    VACCINATION STATUS: Immunization History  Administered Date(s) Administered  . Tdap 11/24/2011     HPI  Debra Barnett is 46 y.o. female who presents today with a medical history as above. she is here to follow-up for hyperthyroidism, status post I-131 thyroid ablation on 11/09/2016.    she has been dealing with symptoms of  Weight loss of 45 pounds over 5 months, palpitations, anxiety/irritability, sleep disturbance,  and heat intolerance for 4-5 months. - She now feels better, regained  13 pounds. Reports improvement of the above symptoms. she denies dysphagia, choking, shortness of breath, not recent voice change. These symptoms are progressively worsening and troubling to her   she has family history of thyroid dysfunction in her mother who has hypothyroidism currently on thyroid hormone replacement, but denies family hx of thyroid cancer. she denies personal history of goiter.  Constitutional: + weight gain, - fatigue, - subjective hyperthermia Eyes: no blurry vision, + xerophthalmia ENT: no sore throat, no nodules palpated in throat, no dysphagia/odynophagia, nor hoarseness Cardiovascular: no Chest Pain, no Shortness of Breath, +  palpitations, no leg swelling Respiratory: no cough, no SOB Gastrointestinal: no Nausea, no Vomiting, no Diarhhea Musculoskeletal: no muscle/joint aches Skin: no rashes Neurological: +  tremors, no numbness, no tingling, no dizziness Psychiatric: no depression, +  anxiety   Objective:    BP (!) 148/88   Pulse 70   Ht 5\' 5"  (1.651 m)   Wt 124 lb (56.2 kg)   BMI 20.63 kg/m   Wt Readings from Last 3 Encounters:  01/16/17 124 lb (56.2 kg)  10/29/16 112 lb (50.8 kg)  10/17/16 111 lb 1.3 oz (50.4 kg)     Constitutional: + Appropriate weight for height, not in acute distress, +anxious state of mind Eyes: PERRLA, EOMI, - exophthalmos ENT: moist mucous membranes, +  thyromegaly, no cervical lymphadenopathy Cardiovascular: + Active precordial activity, + normal Rate and Rhythm, no Murmur/Rubs/Gallops Respiratory:  adequate breathing efforts, no gross chest deformity, Clear to auscultation bilaterally Gastrointestinal: abdomen soft, Non -tender, No distension, Bowel Sounds present Musculoskeletal: no gross deformities, strength intact in all four extremities Skin: moist, warm, no rashes Neurological: -   tremor of outstretched hands,  Normal  Deep Tendon Reflexes  on both lower extremities.   CMP     Component Value Date/Time   NA 138  11/14/2016 0955   K 4.0 11/14/2016 0955   CL 102 11/14/2016 0955   CO2 23 11/14/2016 0955   GLUCOSE 147 (H) 11/14/2016 0955   BUN 8 11/14/2016 0955   CREATININE 0.45 11/14/2016 0955   CREATININE 0.50 09/11/2016 1029   CALCIUM 10.2 11/14/2016 0955   PROT 7.4 11/14/2016 0955   ALBUMIN 3.9 11/14/2016 0955   AST 33 11/14/2016 0955   ALT 30 11/14/2016 0955   ALKPHOS 180 (H) 11/14/2016 0955   BILITOT 0.9 11/14/2016 0955   GFRNONAA >60 11/14/2016 0955   GFRNONAA >89 09/11/2016 1029   GFRAA >60 11/14/2016 0955   GFRAA >89 09/11/2016 1029     CBC    Component Value Date/Time   WBC 16.1 (H) 11/14/2016 0955   RBC 4.92 11/14/2016 0955   HGB 13.6 11/14/2016 0955   HCT 40.7 11/14/2016 0955   PLT 237 11/14/2016 0955  MCV 82.7 11/14/2016 0955   MCH 27.6 11/14/2016 0955   MCHC 33.4 11/14/2016 0955   RDW 13.7 11/14/2016 0955   LYMPHSABS 2.8 11/14/2016 0955   MONOABS 1.6 (H) 11/14/2016 0955   EOSABS 0.1 11/14/2016 0955   BASOSABS 0.0 11/14/2016 0955   Lipid Panel     Component Value Date/Time   CHOL 109 09/11/2016 1029   TRIG 81 09/11/2016 1029   HDL 40 (L) 09/11/2016 1029   CHOLHDL 2.7 09/11/2016 1029   VLDL 16 09/11/2016 1029   LDLCALC 53 09/11/2016 1029    1) Thyroid uptake and scan on 10/24/2016 shows uniform uptake of 63% in 24 hours consistent with Graves' disease.  2) She is status post I-131 thyroid ablation on 11/09/2016  Results for Debra JockJULIAN, Kesha H (MRN [id number]) as of 01/16/2017 15:07  Ref. Range 09/11/2016 10:29 01/08/2017 09:26  TSH Latest Units: mIU/L <0.01 (L) 5.53 (H)  Triiodothyronine (T3) Latest Ref Range: 76 - 181 ng/dL 782.9640.0 (H)   F6,OZHY(QMVHQIT4,Free(Direct) Latest Ref Range: 0.8 - 1.8 ng/dL  0.4 (L)  Thyroxine (T4) Latest Ref Range: 4.5 - 12.0 ug/dL 69.623.8 (H)     Assessment & Plan:   1. 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 Hx of Grave's Disease - Her  11/09/2016 I-131 therapy has taken effect. - She will be initiated on thyroid hormone replacement.  I discussed and initiated levothyroxine 75 Raiford NobleMichael Ross by mouth every morning.   - We discussed about correct intake of levothyroxine, at fasting, with water, separated by at least 30 minutes from breakfast, and separated by more than 4 hours from calcium, iron, multivitamins, acid reflux medications (PPIs). -Patient is made aware of the fact that thyroid hormone replacement is needed for life, dose to be adjusted by periodic monitoring of thyroid function tests. - I will discontinue propranolol at  this time.  - I advised her to maintain close follow up with Delton SeeNelson, Letta PateYvonne Sue, MD for primary care needs.  Follow up plan: Return in about 3 months (around 04/18/2017) for follow up with pre-visit labs. Thank you for involving me in the care of this pleasant patient, and I will continue to update you with her progress.  Marquis LunchGebre Nida, MD [facility] Phone: [phone]  Fax: [phone]   01/16/2017, 3:14 PM  This note was partially dictated with voice recognition software. Similar sounding words can be transcribed inadequately or may not  be corrected upon review.

## 2017-01-28 ENCOUNTER — Other Ambulatory Visit: Payer: Self-pay | Admitting: "Endocrinology

## 2017-01-30 NOTE — Telephone Encounter (Signed)
Pt is requesting propranolol. Should she still be taking this medication

## 2017-01-30 NOTE — Telephone Encounter (Signed)
She does not need propranolol anymore. I advised her to discontinue during her last visit.

## 2017-04-18 ENCOUNTER — Ambulatory Visit: Payer: 59 | Admitting: "Endocrinology

## 2017-04-18 ENCOUNTER — Encounter: Payer: Self-pay | Admitting: "Endocrinology

## 2017-04-30 ENCOUNTER — Other Ambulatory Visit: Payer: Self-pay | Admitting: "Endocrinology

## 2017-07-17 ENCOUNTER — Other Ambulatory Visit: Payer: Self-pay | Admitting: Family Medicine

## 2017-07-17 NOTE — Telephone Encounter (Signed)
Advise needs OV, refilled for next 3 months. No future refills until seen.

## 2017-07-17 NOTE — Telephone Encounter (Signed)
Spoke with patient and let her know Dr.[name] is no longer at our office and she can be transitioned to see Dr.Hagler. I told her she would need to see Dr.Hagler before she would be allowed anymore refills other than the ones Dr.Hagler had just filled. She stated she would call before Monday to set up the appt, and she was due for a pap smear, and she thought the medication needed to be increased. I advised her that Dr.Hagler would not adjust the medication without seeing her first and she verbalized understanding. I offered to transfer her to the front staff to go ahead and schedule her appt because Dr.Hagler is booking out through July and again she stated she would call before Monday to schedule her appt because she had to make sure she had the funds to come in.

## 2017-07-26 ENCOUNTER — Encounter: Payer: Self-pay | Admitting: Family Medicine

## 2017-08-19 ENCOUNTER — Other Ambulatory Visit: Payer: Self-pay | Admitting: "Endocrinology

## 2017-08-29 ENCOUNTER — Other Ambulatory Visit: Payer: Self-pay | Admitting: Family Medicine

## 2017-09-12 ENCOUNTER — Other Ambulatory Visit: Payer: Self-pay | Admitting: Family Medicine

## 2017-09-19 ENCOUNTER — Other Ambulatory Visit: Payer: Self-pay | Admitting: "Endocrinology

## 2017-10-22 ENCOUNTER — Other Ambulatory Visit: Payer: Self-pay | Admitting: "Endocrinology

## 2017-10-22 ENCOUNTER — Other Ambulatory Visit: Payer: Self-pay | Admitting: Family Medicine

## 2017-10-31 ENCOUNTER — Other Ambulatory Visit: Payer: Self-pay | Admitting: Family Medicine

## 2018-04-24 ENCOUNTER — Emergency Department (HOSPITAL_COMMUNITY): Payer: 59

## 2018-04-24 ENCOUNTER — Observation Stay (HOSPITAL_COMMUNITY)
Admission: EM | Admit: 2018-04-24 | Discharge: 2018-04-26 | Disposition: A | Discharge status: Home / Self Care | Payer: 59 | Attending: Family Medicine | Admitting: Family Medicine

## 2018-04-24 ENCOUNTER — Encounter (HOSPITAL_COMMUNITY): Payer: Self-pay | Admitting: Emergency Medicine

## 2018-04-24 ENCOUNTER — Other Ambulatory Visit: Payer: Self-pay

## 2018-04-24 DIAGNOSIS — F17210 Nicotine dependence, cigarettes, uncomplicated: Secondary | ICD-10-CM | POA: Insufficient documentation

## 2018-04-24 DIAGNOSIS — E0590 Thyrotoxicosis, unspecified without thyrotoxic crisis or storm: Secondary | ICD-10-CM | POA: Diagnosis present

## 2018-04-24 DIAGNOSIS — K029 Dental caries, unspecified: Secondary | ICD-10-CM

## 2018-04-24 DIAGNOSIS — J45909 Unspecified asthma, uncomplicated: Secondary | ICD-10-CM | POA: Insufficient documentation

## 2018-04-24 DIAGNOSIS — Z23 Encounter for immunization: Secondary | ICD-10-CM | POA: Insufficient documentation

## 2018-04-24 DIAGNOSIS — Z79899 Other long term (current) drug therapy: Secondary | ICD-10-CM | POA: Diagnosis not present

## 2018-04-24 DIAGNOSIS — L03211 Cellulitis of face: Principal | ICD-10-CM | POA: Diagnosis present

## 2018-04-24 DIAGNOSIS — K0889 Other specified disorders of teeth and supporting structures: Secondary | ICD-10-CM | POA: Diagnosis present

## 2018-04-24 DIAGNOSIS — K219 Gastro-esophageal reflux disease without esophagitis: Secondary | ICD-10-CM | POA: Diagnosis present

## 2018-04-24 DIAGNOSIS — I10 Essential (primary) hypertension: Secondary | ICD-10-CM

## 2018-04-24 LAB — CBC WITH DIFFERENTIAL/PLATELET
Abs Immature Granulocytes: 0.1 10*3/uL — ABNORMAL HIGH (ref 0.00–0.07)
Basophils Absolute: 0.1 10*3/uL (ref 0.0–0.1)
Basophils Relative: 0 %
EOS ABS: 0.1 10*3/uL (ref 0.0–0.5)
EOS PCT: 1 %
HEMATOCRIT: 46.1 % — AB (ref 36.0–46.0)
Hemoglobin: 14.7 g/dL (ref 12.0–15.0)
IMMATURE GRANULOCYTES: 1 %
LYMPHS ABS: 3.7 10*3/uL (ref 0.7–4.0)
Lymphocytes Relative: 19 %
MCH: 29.1 pg (ref 26.0–34.0)
MCHC: 31.9 g/dL (ref 30.0–36.0)
MCV: 91.1 fL (ref 80.0–100.0)
MONO ABS: 1.7 10*3/uL — AB (ref 0.1–1.0)
MONOS PCT: 9 %
NRBC: 0 % (ref 0.0–0.2)
Neutro Abs: 13.7 10*3/uL — ABNORMAL HIGH (ref 1.7–7.7)
Neutrophils Relative %: 70 %
Platelets: 363 10*3/uL (ref 150–400)
RBC: 5.06 MIL/uL (ref 3.87–5.11)
RDW: 13.9 % (ref 11.5–15.5)
WBC: 19.3 10*3/uL — ABNORMAL HIGH (ref 4.0–10.5)

## 2018-04-24 LAB — BASIC METABOLIC PANEL
ANION GAP: 9 (ref 5–15)
BUN: 8 mg/dL (ref 6–20)
CALCIUM: 9.2 mg/dL (ref 8.9–10.3)
CO2: 24 mmol/L (ref 22–32)
CREATININE: 0.91 mg/dL (ref 0.44–1.00)
Chloride: 102 mmol/L (ref 98–111)
Glucose, Bld: 122 mg/dL — ABNORMAL HIGH (ref 70–99)
Potassium: 3.8 mmol/L (ref 3.5–5.1)
Sodium: 135 mmol/L (ref 135–145)

## 2018-04-24 MED ORDER — CLINDAMYCIN PHOSPHATE 600 MG/50ML IV SOLN
600.0000 mg | Freq: Once | INTRAVENOUS | Status: AC
  Administered 2018-04-24: 600 mg via INTRAVENOUS
  Filled 2018-04-24: qty 50

## 2018-04-24 MED ORDER — HYDROCODONE-ACETAMINOPHEN 5-325 MG PO TABS
1.0000 | ORAL_TABLET | Freq: Once | ORAL | Status: AC
  Administered 2018-04-24: 1 via ORAL
  Filled 2018-04-24: qty 1

## 2018-04-24 MED ORDER — IOHEXOL 300 MG/ML  SOLN
75.0000 mL | Freq: Once | INTRAMUSCULAR | Status: AC | PRN
  Administered 2018-04-24: 75 mL via INTRAVENOUS

## 2018-04-24 NOTE — ED Triage Notes (Signed)
Pt C/o back left dental pain taht started this AM. Pt has some swelling to the left side of the jaw. Pt states pain was being tolerated by Orajel this AM. At this time pt states she can no longer "take the pain."

## 2018-04-24 NOTE — ED Provider Notes (Signed)
[facility] EMERGENCY DEPARTMENT Provider Note   CSN: [id number] Arrival date & time: 04/24/18  1932    History   Chief Complaint Chief Complaint  Patient presents with  . Dental Pain    HPI Debra Barnett is a 48 y.o. female.     HPI Patient states that several months back she believes she broke her lower molar on the left.  She has been asymptomatic until yesterday.  She began having pain to the left jaw which is worsened throughout the night and day.  She is had swelling to the jaw.  No fever or chills.  States she has been putting rubbing alcohol on the tooth and Orajel without improvement of her symptoms.  Has not seen a dentist. Past Medical History:  Diagnosis Date  . Allergy    food  . Anemia   . Anxiety   . Arthritis    hips, rotator cuff, hands  . Asthma   . Carpal tunnel syndrome of right wrist   . Colitis Nov 2013  . Depression   . Diverticulosis   . GERD (gastroesophageal reflux disease)   . Hernia, hiatal   . Hypertension   . Thyroid disease     Patient Active Problem List   Diagnosis Date Noted  . Graves' disease 10/29/2016  . Lactose intolerance 10/17/2016  . Hyperthyroidism 10/16/2016  . Diverticulosis 09/11/2016  . GAD (generalized anxiety disorder) 09/11/2016  . Chronic neck pain 09/11/2016  . Tobacco abuse 09/11/2016  . GERD (gastroesophageal reflux disease) 03/27/2012  . Chronic nausea 03/27/2012    Past Surgical History:  Procedure Laterality Date  . COLONOSCOPY WITH ESOPHAGOGASTRODUODENOSCOPY (EGD) N/A 04/11/2012   Procedure: COLONOSCOPY WITH ESOPHAGOGASTRODUODENOSCOPY (EGD);  Surgeon: West Bali, MD;  Location: [facility];  Service: Endoscopy;  Laterality: N/A;  8:30  . WISDOM TOOTH EXTRACTION  1994     OB History   No obstetric history on file.      Home Medications    Prior to Admission medications   Medication Sig Start Date End Date Taking? Authorizing Provider  bismuth subsalicylate (PEPTO BISMOL) 262 MG/15ML  suspension Take 30 mLs by mouth every 6 (six) hours as needed for indigestion.   Yes [provider]  calcium carbonate (TUMS - DOSED IN MG ELEMENTAL CALCIUM) 500 MG chewable tablet Chew 2-3 tablets by mouth daily as needed. For indigestion   Yes [provider]  ibuprofen (ADVIL,MOTRIN) 200 MG tablet Take 800 mg by mouth every 6 (six) hours as needed for mild pain or moderate pain.   Yes [provider]  isopropyl alcohol 70 % external solution Apply topically once as needed (for dental pain).   Yes [provider]  Melatonin 5 MG TABS Take 15 mg by mouth at bedtime.   Yes [provider]    Family History Family History  Problem Relation Age of Onset  . Thyroid disease Mother        hypo  . Depression Mother   . Stroke Father   . Diabetes Father   . Alcohol abuse Father   . Depression Father   . Drug abuse Father   . Hypertension Father   . Early death Father 62       diabetes/stroke  . COPD Maternal Grandmother   . Alcohol abuse Maternal Grandfather   . COPD Maternal Grandfather   . Colon cancer Neg Hx   . Colon polyps Neg Hx     Social History Social History  Tobacco Use  . Smoking status: Current Every Day Smoker    Packs/day: 1.00    Years: 22.00    Pack years: 22.00    Types: Cigarettes    Start date: 02/20/1988  . Smokeless tobacco: Never Used  Substance Use Topics  . Alcohol use: Yes    Comment: occasionally 3 times per year, HX heavy etoh x 2 yrs (20's)  . Drug use: Yes    Types: Marijuana    Comment: pt reports using a friend's pain med at times for severe pain-Past marijuana last couple weeks     Allergies   Adhesive [tape] and Sulfa antibiotics   Review of Systems Review of Systems  Constitutional: Negative for chills and fever.  HENT: Positive for dental problem and facial swelling.   Eyes: Negative for visual disturbance.  Musculoskeletal: Negative for neck pain and neck stiffness.  Skin: Negative for  rash and wound.  Neurological: Positive for headaches. Negative for weakness and light-headedness.  All other systems reviewed and are negative.    Physical Exam Updated Vital Signs BP (!) 157/101 (BP Location: Right Arm)   Pulse 67   Temp 98 F (36.7 C) (Oral)   Resp 15   SpO2 99%   Physical Exam Vitals signs and nursing note reviewed.  Constitutional:      Appearance: Normal appearance. She is well-developed.  HENT:     Head: Normocephalic and atraumatic.      Nose: Nose normal.     Mouth/Throat:     Mouth: Mucous membranes are moist.   Eyes:     Pupils: Pupils are equal, round, and reactive to light.  Neck:     Musculoskeletal: Normal range of motion and neck supple. No neck rigidity or muscular tenderness.  Cardiovascular:     Rate and Rhythm: Normal rate and regular rhythm.     Heart sounds: No murmur. No friction rub. No gallop.   Pulmonary:     Effort: Pulmonary effort is normal.     Breath sounds: Normal breath sounds.  Abdominal:     General: Bowel sounds are normal.     Palpations: Abdomen is soft.     Tenderness: There is no abdominal tenderness. There is no guarding or rebound.  Musculoskeletal: Normal range of motion.        General: No tenderness.  Lymphadenopathy:     Cervical: Cervical adenopathy present.  Skin:    General: Skin is warm and dry.     Capillary Refill: Capillary refill takes less than 2 seconds.     Findings: Erythema present. No rash.     Comments: Erythema overlying left mandibular mass with warmth  Neurological:     General: No focal deficit present.     Mental Status: She is alert and oriented to person, place, and time.  Psychiatric:        Behavior: Behavior normal.      ED Treatments / Results  Labs (all labs ordered are listed, but only abnormal results are displayed) Labs Reviewed  BASIC METABOLIC PANEL - Abnormal; Notable for the following components:      Result Value   Glucose, Bld 122 (*)    All other  components within normal limits  CBC WITH DIFFERENTIAL/PLATELET - Abnormal; Notable for the following components:   WBC 19.3 (*)    HCT 46.1 (*)    Neutro Abs 13.7 (*)    Monocytes Absolute 1.7 (*)    Abs Immature Granulocytes 0.10 (*)  All other components within normal limits    EKG None  Radiology Ct Maxillofacial W Contrast  Result Date: 04/24/2018 CLINICAL DATA:  LEFT dental pain and swelling since this morning. History of Graves disease. EXAM: CT MAXILLOFACIAL WITH CONTRAST TECHNIQUE: Multidetector CT imaging of the maxillofacial structures was performed with intravenous contrast. Multiplanar CT image reconstructions were also generated. CONTRAST:  75mL OMNIPAQUE IOHEXOL 300 MG/ML  SOLN COMPARISON:  None. FINDINGS: OSSEOUS: No acute facial fracture. The mandible is intact, the condyles are located. Scattered dental caries and periapical lucencies/abscess including tooth 20 associated with the subperiosteal abscess. ORBITS: Ocular globes and orbital contents are normal. SINUSES: Paranasal sinuses are well aerated. Intact nasal septum is midline. Mastoid aircells are well aerated. SOFT TISSUES: 2 x 11 x 16 mm (transverse by cc by AP) LEFT mandible body gingival subperiosteal reaction. LEFT lower facial soft tissue swelling with thickened platysma and subcutaneous fat stranding. No subcutaneous gas or radiopaque foreign bodies. 10 mm short access hyperemic LEFT level 2A lymph node. LIMITED INTRACRANIAL: Normal. IMPRESSION: 1. 2 x 11 x 16 mm LEFT mandible body subperiosteal odontogenic abscess associated with poor dentition. 2. LEFT facial cellulitis and mild reactive cervical lymphadenopathy. Electronically Signed   By: Awilda Metro M.D.   On: 04/24/2018 22:02    Procedures Procedures (including critical care time)  Medications Ordered in ED Medications  clindamycin (CLEOCIN) IVPB 600 mg (600 mg Intravenous New Bag/Given 04/24/18 2215)  HYDROcodone-acetaminophen (NORCO/VICODIN) 5-325  MG per tablet 1 tablet (1 tablet Oral Given 04/24/18 2044)  iohexol (OMNIPAQUE) 300 MG/ML solution 75 mL (75 mLs Intravenous Contrast Given 04/24/18 2149)     Initial Impression / Assessment and Plan / ED Course  I have reviewed the triage vital signs and the nursing notes.  Pertinent labs & imaging results that were available during my care of the patient were reviewed by me and considered in my medical decision making (see chart for details).        Patient with small periosteal abscess and overlying cellulitis on CT scan.  Elevated white blood cell count.  Initiated clindamycin.  Discussed with hospitalist and will admit for facial cellulitis. Final Clinical Impressions(s) / ED Diagnoses   Final diagnoses:  Facial cellulitis  Dental caries    ED Discharge Orders    None       Loren Racer, MD 04/24/18 2244

## 2018-04-25 ENCOUNTER — Encounter (HOSPITAL_COMMUNITY): Payer: Self-pay | Admitting: Emergency Medicine

## 2018-04-25 DIAGNOSIS — K029 Dental caries, unspecified: Secondary | ICD-10-CM

## 2018-04-25 DIAGNOSIS — L03211 Cellulitis of face: Principal | ICD-10-CM

## 2018-04-25 DIAGNOSIS — I10 Essential (primary) hypertension: Secondary | ICD-10-CM | POA: Diagnosis not present

## 2018-04-25 LAB — CBC
HCT: 45.4 % (ref 36.0–46.0)
HEMOGLOBIN: 14.9 g/dL (ref 12.0–15.0)
MCH: 30.5 pg (ref 26.0–34.0)
MCHC: 32.8 g/dL (ref 30.0–36.0)
MCV: 93 fL (ref 80.0–100.0)
Platelets: 332 10*3/uL (ref 150–400)
RBC: 4.88 MIL/uL (ref 3.87–5.11)
RDW: 13.7 % (ref 11.5–15.5)
WBC: 15.9 10*3/uL — ABNORMAL HIGH (ref 4.0–10.5)
nRBC: 0 % (ref 0.0–0.2)

## 2018-04-25 LAB — COMPREHENSIVE METABOLIC PANEL
ALT: 17 U/L (ref 0–44)
AST: 16 U/L (ref 15–41)
Albumin: 4.2 g/dL (ref 3.5–5.0)
Alkaline Phosphatase: 83 U/L (ref 38–126)
Anion gap: 6 (ref 5–15)
BUN: 7 mg/dL (ref 6–20)
CALCIUM: 8.8 mg/dL — AB (ref 8.9–10.3)
CO2: 26 mmol/L (ref 22–32)
Chloride: 106 mmol/L (ref 98–111)
Creatinine, Ser: 1 mg/dL (ref 0.44–1.00)
GFR calc Af Amer: >60 mL/min (ref >60)
GFR calc non Af Amer: >60 mL/min (ref >60)
Glucose, Bld: 111 mg/dL — ABNORMAL HIGH (ref 70–99)
Potassium: 3.8 mmol/L (ref 3.5–5.1)
Sodium: 138 mmol/L (ref 135–145)
Total Bilirubin: 0.9 mg/dL (ref 0.3–1.2)
Total Protein: 7.4 g/dL (ref 6.5–8.1)

## 2018-04-25 MED ORDER — ONDANSETRON HCL 4 MG PO TABS: 4.0000 mg | ORAL_TABLET | Freq: Four times a day (QID) | ORAL | Status: DC | PRN

## 2018-04-25 MED ORDER — LISINOPRIL 10 MG PO TABS
10.0000 mg | ORAL_TABLET | Freq: Every day | ORAL | Status: DC
  Administered 2018-04-25 – 2018-04-26 (×2): 10 mg via ORAL
  Filled 2018-04-25 (×2): qty 1

## 2018-04-25 MED ORDER — INFLUENZA VAC SPLIT QUAD 0.5 ML IM SUSY
0.5000 mL | PREFILLED_SYRINGE | INTRAMUSCULAR | Status: AC
  Administered 2018-04-26: 0.5 mL via INTRAMUSCULAR
  Filled 2018-04-25: qty 0.5

## 2018-04-25 MED ORDER — CLINDAMYCIN PHOSPHATE 600 MG/50ML IV SOLN
600.0000 mg | Freq: Four times a day (QID) | INTRAVENOUS | Status: DC
  Administered 2018-04-25 – 2018-04-26 (×6): 600 mg via INTRAVENOUS
  Filled 2018-04-25 (×6): qty 50

## 2018-04-25 MED ORDER — ONDANSETRON HCL 4 MG/2ML IJ SOLN: 4.0000 mg | Freq: Four times a day (QID) | INTRAMUSCULAR | Status: DC | PRN

## 2018-04-25 MED ORDER — METHYLPREDNISOLONE SODIUM SUCC 125 MG IJ SOLR
125.0000 mg | Freq: Three times a day (TID) | INTRAMUSCULAR | Status: DC
  Administered 2018-04-25 – 2018-04-26 (×4): 125 mg via INTRAVENOUS
  Filled 2018-04-25 (×4): qty 2

## 2018-04-25 MED ORDER — ENOXAPARIN SODIUM 40 MG/0.4ML ~~LOC~~ SOLN
40.0000 mg | SUBCUTANEOUS | Status: DC
  Administered 2018-04-25 – 2018-04-26 (×2): 40 mg via SUBCUTANEOUS
  Filled 2018-04-25 (×2): qty 0.4

## 2018-04-25 MED ORDER — METHYLPREDNISOLONE SODIUM SUCC 125 MG IJ SOLR
125.0000 mg | Freq: Once | INTRAMUSCULAR | Status: AC
  Administered 2018-04-25: 125 mg via INTRAVENOUS
  Filled 2018-04-25: qty 2

## 2018-04-25 MED ORDER — IBUPROFEN 800 MG PO TABS: 800.0000 mg | ORAL_TABLET | Freq: Four times a day (QID) | ORAL | Status: DC | PRN

## 2018-04-25 MED ORDER — SODIUM CHLORIDE 0.9 % IV SOLN
INTRAVENOUS | Status: DC
  Administered 2018-04-25 (×2): via INTRAVENOUS

## 2018-04-25 MED ORDER — OXYCODONE-ACETAMINOPHEN 5-325 MG PO TABS
1.0000 | ORAL_TABLET | Freq: Four times a day (QID) | ORAL | Status: DC | PRN
  Administered 2018-04-25 – 2018-04-26 (×5): 2 via ORAL
  Filled 2018-04-25 (×5): qty 2

## 2018-04-25 MED ORDER — DIPHENHYDRAMINE HCL 25 MG PO CAPS
50.0000 mg | ORAL_CAPSULE | Freq: Once | ORAL | Status: AC
  Administered 2018-04-25: 50 mg via ORAL
  Filled 2018-04-25: qty 2

## 2018-04-25 NOTE — ED Notes (Signed)
Report to Dana, RN

## 2018-04-25 NOTE — H&P (Signed)
TRH H&P    Debra Barnett Demographics:    Debra Barnett, is a 48 y.o. female  MRN: [id number]  DOB - October 30, 1970  Admit Date - 04/24/2018  Referring MD/NP/PA: Loren Racer  Outpatient Primary MD for the Debra Barnett is Debra Barnett, No Pcp Per  Debra Barnett coming from: Home  Chief complaint-facial swelling   HPI:    Debra Barnett  is a 48 y.o. female, with history of hypertension, hypothyroidism not on any medications came to hospital with swelling of left cheek.  Debra Barnett said that she believes that she broke her lower molar on the left..  She has been asymptomatic until yesterday when she started having pain in left jaw which worsened throughout the day. Complains of severe pain 8/10 intensity. She developed swelling of the jaw.  Denies fever or chills.  She has been using rubbing alcohol on the tooth and Orajel without any improvement of symptoms.  Debra Barnett has not seen a dentist. She denies chest pain or shortness of breath. Denies nausea vomiting or diarrhea.     Review of systems:    In addition to the HPI above,    All other systems reviewed and are negative.    Past History of the following :    Past Medical History:  Diagnosis Date  . Allergy    food  . Anemia   . Anxiety   . Arthritis    hips, rotator cuff, hands  . Asthma   . Carpal tunnel syndrome of right wrist   . Colitis Nov 2013  . Depression   . Diverticulosis   . GERD (gastroesophageal reflux disease)   . Hernia, hiatal   . Hypertension   . Thyroid disease       Past Surgical History:  Procedure Laterality Date  . COLONOSCOPY WITH ESOPHAGOGASTRODUODENOSCOPY (EGD) N/A 04/11/2012   Procedure: COLONOSCOPY WITH ESOPHAGOGASTRODUODENOSCOPY (EGD);  Surgeon: West Bali, MD;  Location: [facility];  Service: Endoscopy;  Laterality: N/A;  8:30  . WISDOM TOOTH EXTRACTION  1994      Social History:      Social History   Tobacco Use  .  Smoking status: Current Every Day Smoker    Packs/day: 1.00    Years: 22.00    Pack years: 22.00    Types: Cigarettes    Start date: 02/20/1988  . Smokeless tobacco: Never Used  Substance Use Topics  . Alcohol use: Yes    Comment: occasionally 3 times per year, HX heavy etoh x 2 yrs (20's)       Family History :     Family History  Problem Relation Age of Onset  . Thyroid disease Mother        hypo  . Depression Mother   . Stroke Father   . Diabetes Father   . Alcohol abuse Father   . Depression Father   . Drug abuse Father   . Hypertension Father   . Early death Father 81       diabetes/stroke  . COPD Maternal Grandmother   . Alcohol abuse Maternal  Grandfather   . COPD Maternal Grandfather   . Colon cancer Neg Hx   . Colon polyps Neg Hx       Home Medications:   Prior to Admission medications   Medication Sig Start Date End Date Taking? Authorizing Provider  bismuth subsalicylate (PEPTO BISMOL) 262 MG/15ML suspension Take 30 mLs by mouth every 6 (six) hours as needed for indigestion.   Yes [provider]  calcium carbonate (TUMS - DOSED IN MG ELEMENTAL CALCIUM) 500 MG chewable tablet Chew 2-3 tablets by mouth daily as needed. For indigestion   Yes [provider]  ibuprofen (ADVIL,MOTRIN) 200 MG tablet Take 800 mg by mouth every 6 (six) hours as needed for mild pain or moderate pain.   Yes [provider]  isopropyl alcohol 70 % external solution Apply topically once as needed (for dental pain).   Yes [provider]  Melatonin 5 MG TABS Take 15 mg by mouth at bedtime.   Yes [provider]     Allergies:     Allergies  Allergen Reactions  . Adhesive [Tape] Other (See Comments)    Redness, small rash   . Sulfa Antibiotics Itching     Physical Exam:   Vitals  Blood pressure (!) 144/96, pulse 66, temperature 98 F (36.7 C), temperature source Oral, resp. rate 15, SpO2 100 %.  1.  General: Appears in no acute  distress  2. Psychiatric: Alert, oriented x3, intact insight and judgment  3. Neurologic: Cranial nerves II through grossly intact, motor strength 5/5 in all extremities  4. HEENMT:  Oral mucosa is pink and moist, mild swelling noted in the left cheek, tender to palpation  5. Respiratory : Clear to auscultation bilaterally  6. Cardiovascular : S1-S2, regular, no murmur auscultated  7. Gastrointestinal:  Abdomen is soft, nontender, no organomegaly      Data Review:    CBC Recent Labs  Lab 04/24/18 2043  WBC 19.3*  HGB 14.7  HCT 46.1*  PLT 363  MCV 91.1  MCH 29.1  MCHC 31.9  RDW 13.9  LYMPHSABS 3.7  MONOABS 1.7*  EOSABS 0.1  BASOSABS 0.1   ------------------------------------------------------------------------------------------------------------------  Results for orders placed or performed during the hospital encounter of 04/24/18 (from the past 48 hour(s))  Basic metabolic panel     Status: Abnormal   Collection Time: 04/24/18  8:43 PM  Result Value Ref Range   Sodium 135 135 - 145 mmol/L   Potassium 3.8 3.5 - 5.1 mmol/L   Chloride 102 98 - 111 mmol/L   CO2 24 22 - 32 mmol/L   Glucose, Bld 122 (H) 70 - 99 mg/dL   BUN 8 6 - 20 mg/dL   Creatinine, Ser 1.610.91 0.44 - 1.00 mg/dL   Calcium 9.2 8.9 - 09.610.3 mg/dL   GFR calc non Af Amer >60 >60 mL/min   GFR calc Af Amer >60 >60 mL/min   Anion gap 9 5 - 15    Comment: Performed at [facility], [address] [place], [place] [id number]  CBC with Differential/Platelet     Status: Abnormal   Collection Time: 04/24/18  8:43 PM  Result Value Ref Range   WBC 19.3 (H) 4.0 - 10.5 K/uL   RBC 5.06 3.87 - 5.11 MIL/uL   Hemoglobin 14.7 12.0 - 15.0 g/dL   HCT 98.146.1 (H) 19.136.0 - 47.846.0 %   MCV 91.1 80.0 - 100.0 fL   MCH 29.1 26.0 - 34.0 pg   MCHC 31.9 30.0 -  36.0 g/dL   RDW 57.0 17.7 - 93.9 %   Platelets 363 150 - 400 K/uL    Comment: PLATELET COUNT CONFIRMED BY SMEAR SPECIMEN CHECKED FOR CLOTS    nRBC 0.0 0.0 - 0.2 %    Neutrophils Relative % 70 %   Neutro Abs 13.7 (H) 1.7 - 7.7 K/uL   Lymphocytes Relative 19 %   Lymphs Abs 3.7 0.7 - 4.0 K/uL   Monocytes Relative 9 %   Monocytes Absolute 1.7 (H) 0.1 - 1.0 K/uL   Eosinophils Relative 1 %   Eosinophils Absolute 0.1 0.0 - 0.5 K/uL   Basophils Relative 0 %   Basophils Absolute 0.1 0.0 - 0.1 K/uL   Immature Granulocytes 1 %   Abs Immature Granulocytes 0.10 (H) 0.00 - 0.07 K/uL    Comment: Performed at [facility], [address], [place], [place] [address]    Chemistries  Recent Labs  Lab 04/24/18 2043  NA 135  K 3.8  CL 102  CO2 24  GLUCOSE 122*  BUN 8  CREATININE 0.91  CALCIUM 9.2    --------------------------------------------------------------------------------------------------------------- Urine analysis:    Component Value Date/Time   COLORURINE YELLOW 11/14/2016 1026   APPEARANCEUR CLEAR 11/14/2016 1026   LABSPEC 1.015 11/14/2016 1026   PHURINE 8.0 11/14/2016 1026   GLUCOSEU NEGATIVE 11/14/2016 1026   HGBUR NEGATIVE 11/14/2016 1026   BILIRUBINUR NEGATIVE 11/14/2016 1026   KETONESUR 5 (A) 11/14/2016 1026   PROTEINUR NEGATIVE 11/14/2016 1026   UROBILINOGEN 0.2 12/12/2011 1228   NITRITE NEGATIVE 11/14/2016 1026   LEUKOCYTESUR NEGATIVE 11/14/2016 1026      Imaging Results:    Ct Maxillofacial W Contrast  Result Date: 04/24/2018 CLINICAL DATA:  LEFT dental pain and swelling since this morning. History of Graves disease. EXAM: CT MAXILLOFACIAL WITH CONTRAST TECHNIQUE: Multidetector CT imaging of the maxillofacial structures was performed with intravenous contrast. Multiplanar CT image reconstructions were also generated. CONTRAST:  49mL OMNIPAQUE IOHEXOL 300 MG/ML  SOLN COMPARISON:  None. FINDINGS: OSSEOUS: No acute facial fracture. The mandible is intact, the condyles are located. Scattered dental caries and periapical lucencies/abscess including tooth 20 associated with the subperiosteal abscess. ORBITS: Ocular globes and orbital  contents are normal. SINUSES: Paranasal sinuses are well aerated. Intact nasal septum is midline. Mastoid aircells are well aerated. SOFT TISSUES: 2 x 11 x 16 mm (transverse by cc by [facility]) LEFT mandible body gingival subperiosteal reaction. LEFT lower facial soft tissue swelling with thickened platysma and subcutaneous fat stranding. No subcutaneous gas or radiopaque foreign bodies. 10 mm short access hyperemic LEFT level 2A lymph node. LIMITED INTRACRANIAL: Normal. IMPRESSION: 1. 2 x 11 x 16 mm LEFT mandible body subperiosteal odontogenic abscess associated with poor dentition. 2. LEFT facial cellulitis and mild reactive cervical lymphadenopathy. Electronically Signed   By: Awilda Metro M.D.   On: 04/24/2018 22:02     Assessment & Plan:    Active Problems:   Facial cellulitis   1. Facial cellulitis-seen on maxillofacial CT with contrast, Debra Barnett started on clindamycin.  We will continue with IV clindamycin 600 mg every 6 hours.  2. Leukocytosis-WBC 19,000, likely from above.  Started on IV antibiotics as above.  Follow CBC in a.m.  3. Subperiosteal odontogenic abscess-CT scan showed left mandible body subperiosteal odontogenic abscess associated with poor dentition.  Debra Barnett started on IV antibiotics as above.  She needs to follow-up with dentist as outpatient.    DVT Prophylaxis-   Lovenox   AM Labs Ordered, also please review Full Orders  Family Communication: Admission, patients condition and plan of care including tests being ordered have been discussed with the Debra Barnett  who indicate understanding and agree with the plan and Code Status.  Code Status: Full code  Admission status: Observation: Based on patients clinical presentation and evaluation of above clinical data, I have made determination that Debra Barnett would need less than 2 midnight stay in the hospital.  Time spent in minutes : 60 minutes   Meredeth Ide M.D on 04/25/2018 at 12:04 AM

## 2018-04-25 NOTE — Plan of Care (Signed)

## 2018-04-25 NOTE — ED Notes (Signed)
Call for report  Nurse unable to take report

## 2018-04-25 NOTE — Progress Notes (Signed)
PROGRESS NOTE  Debra Barnett:[id number] DOB: May 13, 1970 DOA: 04/24/2018 PCP: Patient, No Pcp Per  HPI/Recap of past 24 hours: Patient seen and examined at bedside still complaining of pain and swelling.  She complained that her swelling is worsening and heart rate seems like there is swelling in her throat.  I had ordered Solu-Medrol with improvement and upon getting to the floor she is complaining of throat swelling again and have ordered Benadryl  Assessment/Plan: Active Problems:   Facial cellulitis  #1 left facial cellulitis secondary to broken tooth Patient on IV clindamycin  2.  Leukocytosis initial WBC was 19 recheck this morning is slightly improved to 15 we will continue to monitor.  Continue IV clindamycin  3.  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  Hypertension.  We will start her on antihypertensive with lisinopril 10 mg and cardiac diet  Code Status: Full  Severity of Illness: The appropriate patient status for this patient is OBSERVATION. Observation status is judged to be reasonable and necessary in order to provide the required intensity of service to ensure the patient's safety. The patient's presenting symptoms, physical exam findings, and initial radiographic and laboratory data in the context of their medical condition is felt to place them at decreased risk for further clinical deterioration. Furthermore, it is anticipated that the patient will be medically stable for discharge from the hospital within 2 midnights of admission. The following factors support the patient status of observation.   " The patient's presenting symptoms include cellulitis of the face. " The physical exam findings include swelling redness of the left face subjective throat swelling with difficulty swallowing requiring IV Solu-Medrol and requiring IV antibiotics.      Family Communication: Patient at  bedside  Disposition Plan: Home when stable   Consultants:  None  Procedures:  None  Antimicrobials:  Clindamycin  DVT prophylaxis: Lovenox   Objective: Vitals:   04/24/18 2216 04/24/18 2330 04/25/18 0627 04/25/18 0800  BP: (!) 157/101 (!) 144/96 (!) 162/103 (!) 158/82  Pulse: 67 66 66 62  Resp: 15  16 14   Temp: 98 F (36.7 C)     TempSrc: Oral     SpO2: 99% 100% 99% 98%    Intake/Output Summary (Last 24 hours) at 04/25/2018 1129 Last data filed at 04/25/2018 5366 Gross per 24 hour  Intake 42.67 ml  Output -  Net 42.67 ml   There were no vitals filed for this visit. There is no height or weight on file to calculate BMI.  Exam:  . General: 48 y.o. year-old female well developed well nourished in no acute distress.  Alert and oriented x3. Marland Kitchen HEENT: Swelling of the left face especially in the lower jaw area.  Speech is clear.  Throat is clear. . Cardiovascular: Regular rate and rhythm with no rubs or gallops.  No thyromegaly or JVD noted.   Marland Kitchen Respiratory: Clear to auscultation with no wheezes or rales. Good inspiratory effort. . Abdomen: Soft nontender nondistended with normal bowel sounds x4 quadrants. . Musculoskeletal: No lower extremity edema. 2/4 pulses in all 4 extremities. . Skin: No ulcerative lesions noted or rashes, . Psychiatry: Mood is appropriate for condition and setting    Data Reviewed: CBC: Recent Labs  Lab 04/24/18 2043 04/25/18 0529  WBC 19.3* 15.9*  NEUTROABS 13.7*  --   HGB 14.7 14.9  HCT 46.1* 45.4  MCV 91.1 93.0  PLT 363 332   Basic Metabolic Panel:  Recent Labs  Lab 04/24/18 2043 04/25/18 0529  NA 135 138  K 3.8 3.8  CL 102 106  CO2 24 26  GLUCOSE 122* 111*  BUN 8 7  CREATININE 0.91 1.00  CALCIUM 9.2 8.8*   GFR: CrCl cannot be calculated (Unknown ideal weight.). Liver Function Tests: Recent Labs  Lab 04/25/18 0529  AST 16  ALT 17  ALKPHOS 83  BILITOT 0.9  PROT 7.4  ALBUMIN 4.2   No results for input(s):  LIPASE, AMYLASE in the last 168 hours. No results for input(s): AMMONIA in the last 168 hours. Coagulation Profile: No results for input(s): INR, PROTIME in the last 168 hours. Cardiac Enzymes: No results for input(s): CKTOTAL, CKMB, CKMBINDEX, TROPONINI in the last 168 hours. BNP (last 3 results) No results for input(s): PROBNP in the last 8760 hours. HbA1C: No results for input(s): HGBA1C in the last 72 hours. CBG: No results for input(s): GLUCAP in the last 168 hours. Lipid Profile: No results for input(s): CHOL, HDL, LDLCALC, TRIG, CHOLHDL, LDLDIRECT in the last 72 hours. Thyroid Function Tests: No results for input(s): TSH, T4TOTAL, FREET4, T3FREE, THYROIDAB in the last 72 hours. Anemia Panel: No results for input(s): VITAMINB12, FOLATE, FERRITIN, TIBC, IRON, RETICCTPCT in the last 72 hours. Urine analysis:    Component Value Date/Time   COLORURINE YELLOW 11/14/2016 1026   APPEARANCEUR CLEAR 11/14/2016 1026   LABSPEC 1.015 11/14/2016 1026   PHURINE 8.0 11/14/2016 1026   GLUCOSEU NEGATIVE 11/14/2016 1026   HGBUR NEGATIVE 11/14/2016 1026   BILIRUBINUR NEGATIVE 11/14/2016 1026   KETONESUR 5 (A) 11/14/2016 1026   PROTEINUR NEGATIVE 11/14/2016 1026   UROBILINOGEN 0.2 12/12/2011 1228   NITRITE NEGATIVE 11/14/2016 1026   LEUKOCYTESUR NEGATIVE 11/14/2016 1026   Sepsis Labs: @LABRCNTIP (procalcitonin:4,lacticidven:4)  )No results found for this or any previous visit (from the past 240 hour(s)).    Studies: Ct Maxillofacial W Contrast  Result Date: 04/24/2018 CLINICAL DATA:  LEFT dental pain and swelling since this morning. History of Graves disease. EXAM: CT MAXILLOFACIAL WITH CONTRAST TECHNIQUE: Multidetector CT imaging of the maxillofacial structures was performed with intravenous contrast. Multiplanar CT image reconstructions were also generated. CONTRAST:  31mL OMNIPAQUE IOHEXOL 300 MG/ML  SOLN COMPARISON:  None. FINDINGS: OSSEOUS: No acute facial fracture. The mandible is  intact, the condyles are located. Scattered dental caries and periapical lucencies/abscess including tooth 20 associated with the subperiosteal abscess. ORBITS: Ocular globes and orbital contents are normal. SINUSES: Paranasal sinuses are well aerated. Intact nasal septum is midline. Mastoid aircells are well aerated. SOFT TISSUES: 2 x 11 x 16 mm (transverse by cc by [facility]) LEFT mandible body gingival subperiosteal reaction. LEFT lower facial soft tissue swelling with thickened platysma and subcutaneous fat stranding. No subcutaneous gas or radiopaque foreign bodies. 10 mm short access hyperemic LEFT level 2A lymph node. LIMITED INTRACRANIAL: Normal. IMPRESSION: 1. 2 x 11 x 16 mm LEFT mandible body subperiosteal odontogenic abscess associated with poor dentition. 2. LEFT facial cellulitis and mild reactive cervical lymphadenopathy. Electronically Signed   By: Awilda Metro M.D.   On: 04/24/2018 22:02    Scheduled Meds: . enoxaparin (LOVENOX) injection  40 mg Subcutaneous Q24H    Continuous Infusions: . sodium chloride Stopped (04/25/18 1032)  . clindamycin (CLEOCIN) IV Stopped (04/25/18 2500)     LOS: 0 days     Myrtie Neither, MD [facility]  To reach me or the doctor on call, go to: [email or web address] Password [email or web address]  04/25/2018, 11:29 AM

## 2018-04-26 DIAGNOSIS — I10 Essential (primary) hypertension: Secondary | ICD-10-CM

## 2018-04-26 DIAGNOSIS — L03211 Cellulitis of face: Secondary | ICD-10-CM | POA: Diagnosis not present

## 2018-04-26 LAB — CBC WITH DIFFERENTIAL/PLATELET
Abs Immature Granulocytes: 0.21 10*3/uL — ABNORMAL HIGH (ref 0.00–0.07)
Basophils Absolute: 0 10*3/uL (ref 0.0–0.1)
Basophils Relative: 0 %
EOS ABS: 0 10*3/uL (ref 0.0–0.5)
EOS PCT: 0 %
HEMATOCRIT: 40.9 % (ref 36.0–46.0)
Hemoglobin: 13.5 g/dL (ref 12.0–15.0)
Immature Granulocytes: 1 %
Lymphocytes Relative: 8 %
Lymphs Abs: 1.8 10*3/uL (ref 0.7–4.0)
MCH: 30.3 pg (ref 26.0–34.0)
MCHC: 33 g/dL (ref 30.0–36.0)
MCV: 91.9 fL (ref 80.0–100.0)
Monocytes Absolute: 0.7 10*3/uL (ref 0.1–1.0)
Monocytes Relative: 3 %
Neutro Abs: 19.1 10*3/uL — ABNORMAL HIGH (ref 1.7–7.7)
Neutrophils Relative %: 88 %
Platelets: 327 10*3/uL (ref 150–400)
RBC: 4.45 MIL/uL (ref 3.87–5.11)
RDW: 13.7 % (ref 11.5–15.5)
WBC: 21.8 10*3/uL — ABNORMAL HIGH (ref 4.0–10.5)
nRBC: 0 % (ref 0.0–0.2)

## 2018-04-26 LAB — BASIC METABOLIC PANEL
Anion gap: 10 (ref 5–15)
BUN: 10 mg/dL (ref 6–20)
CO2: 21 mmol/L — AB (ref 22–32)
Calcium: 8.4 mg/dL — ABNORMAL LOW (ref 8.9–10.3)
Chloride: 104 mmol/L (ref 98–111)
Creatinine, Ser: 0.76 mg/dL (ref 0.44–1.00)
GFR calc Af Amer: >60 mL/min (ref >60)
GFR calc non Af Amer: >60 mL/min (ref >60)
Glucose, Bld: 150 mg/dL — ABNORMAL HIGH (ref 70–99)
Potassium: 3.6 mmol/L (ref 3.5–5.1)
Sodium: 135 mmol/L (ref 135–145)

## 2018-04-26 LAB — HIV ANTIBODY (ROUTINE TESTING W REFLEX): HIV Screen 4th Generation wRfx: NONREACTIVE

## 2018-04-26 MED ORDER — PREDNISONE 10 MG PO TABS: 40.0000 mg | ORAL_TABLET | Freq: Every day | ORAL | 0 refills | Status: AC

## 2018-04-26 MED ORDER — LISINOPRIL 5 MG PO TABS: 5.0000 mg | ORAL_TABLET | Freq: Every day | ORAL | 0 refills | Status: DC

## 2018-04-26 MED ORDER — LISINOPRIL 5 MG PO TABS: 5.0000 mg | ORAL_TABLET | Freq: Every day | ORAL | Status: DC

## 2018-04-26 MED ORDER — CLINDAMYCIN HCL 300 MG PO CAPS: 300.0000 mg | ORAL_CAPSULE | Freq: Four times a day (QID) | ORAL | 0 refills | Status: AC

## 2018-04-26 MED ORDER — ACETAMINOPHEN 325 MG PO TABS: 650.0000 mg | ORAL_TABLET | ORAL | 2 refills | Status: DC | PRN

## 2018-04-26 NOTE — Progress Notes (Signed)
Patient discharged home today per MD orders. Patient vital signs WDL. IV removed and site WDL. Discharge Instructions including follow up appointments, medications, and education reviewed with patient. Patient verbalizes understanding. Patient is transported out via wheelchair.  

## 2018-04-26 NOTE — Discharge Summary (Signed)
Discharge Summary  Debra Barnett AJO:[id number] DOB: 1970-10-31  PCP: Patient, No Pcp Per  Admit date: 04/24/2018 Discharge date: 04/26/2018  Time spent: 30 minutes  Recommendations for Outpatient Follow-up:  1. Primary care provider to recheck white cell count in 3 to 5 days 2. Need to follow-up with a dentist  Discharge Diagnoses:  Active Hospital Problems   Diagnosis Date Noted  . Facial cellulitis 04/24/2018  . Hypertension 04/26/2018  . Hyperthyroidism 10/16/2016  . GERD (gastroesophageal reflux disease) 03/27/2012    Resolved Hospital Problems  No resolved problems to display.    Discharge Condition: Improved  Diet recommendation: Regular  Vitals:   04/25/18 2117 04/26/18 0551  BP: 134/88 97/67  Pulse: 68 (!) 59  Resp: 20 16  Temp: 98.4 F (36.9 C) 98.1 F (36.7 C)  SpO2: 96% 98%    History of present illness:  48 year old female with medical history significant for hypertension but has not been taking medicine because she says she felt better.  Hypothyroidism still also not on medication.  She stated that she lost her primary care provider and has not reestablished care with any other doctor.  She was admitted with swelling of her left face with tooth pain which had been doctoring at home with rubbing alcohol and oral gel with no improvement in her symptoms.  Patient remained afebrile throughout her hospital stay.  It was noted that her white count was elevated.  She did have some.  Of worsening swelling and seem to complain of dysphagia and she was started on Solu-Medrol and given Benadryl with great improvement she did not have any swallow difficulty however.  She received Vanco clindamycin 600 mg every 6 hours.  Her white count is still elevated but it could be reactive due to the Solu-Medrol but her white count is normal.  She is being discharged in improved condition she will complete her antibiotics orally for 10 days 300 mg 4 times a day for 10 days. She was  noted to have elevated blood pressures in the 183/104 was she was started on lisinopril 10 mg yesterday with great effectiveness her blood pressure went down to 97/67 so I have reduced the lisinopril to 5 mg daily instead of 10 mg daily.  She will need to follow-up with primary care provider to monitor her blood pressure and adjust medication accordingly  Hospital Course:  Principal Problem:   Facial cellulitis Active Problems:   GERD (gastroesophageal reflux disease)   Hyperthyroidism   Hypertension  Patient remained afebrile throughout her hospital stay.  It was noted that her white count was elevated.  She was started on clindamycin 600 mg IV every 6 hours, she did have some worsening swelling of her left jaw and face with increased pain and seem to complain of dysphagia at the emergency room and she was started on Solu-Medrol and given Benadryl with great improvement.  She did not have any swallow difficulty however.  She received Vanco clindamycin 600 mg every 6 hours.  Her white count is still elevated it is 21 today but it could be reactive due to the Solu-Medrol but she is afebrile and no worsening cellulitis of the face or pain.  She is being discharged in improved condition she will complete her antibiotics orally for 10 days with clindamycin 300 mg 4 times a day for 10 days. She was noted to have elevated blood pressures in the 183/104 and she was started on lisinopril 10 mg yesterday with great effectiveness, her blood  pressure went down to 97/67 so I have reduced the lisinopril to 5 mg daily instead of 10 mg daily.  She will need to follow-up with primary care provider to monitor her blood pressure and adjust medication accordingly. For pain management she can take ibuprofen and Tylenol in combination for moderate to severe pain otherwise she can take either of them separately  Procedures:  None  Consultations:  None  Discharge Exam: BP 97/67 (BP Location: Right Arm)   Pulse (!)  59   Temp 98.1 F (36.7 C) (Oral)   Resp 16   Ht  (1.626 m)   Wt 63.5 kg   SpO2 98%   BMI 24.03 kg/m   General: Alert oriented x3 not in distress HEENT: Swelling of the left jaw is much improved, there is mild redness, less tender to palpation, there is  No cervical lymphadenopathy Cardiovascular: Regular rate and rhythm no murmur Respiratory: Work of breathing is normal no wheezing no rales  Discharge Instructions You were cared for by a hospitalist during your hospital stay. If you have any questions about your discharge medications or the care you received while you were in the hospital after you are discharged, you can call the unit and asked to speak with the hospitalist on call if the hospitalist that took care of you is not available. Once you are discharged, your primary care physician will handle any further medical issues. Please note that NO REFILLS for any discharge medications will be authorized once you are discharged, as it is imperative that you return to your primary care physician (or establish a relationship with a primary care physician if you do not have one) for your aftercare needs so that they can reassess your need for medications and monitor your lab values.  Discharge Instructions    Call MD for:  severe uncontrolled pain   Complete by:  As directed    Call MD for:  temperature >100.4   Complete by:  As directed    Diet - low sodium heart healthy   Complete by:  As directed    Discharge instructions   Complete by:  As directed    Follow-up primary care provider to recheck white blood cell count in 3 to 5 days   Increase activity slowly   Complete by:  As directed      Allergies as of 04/26/2018      Reactions   Adhesive [tape] Other (See Comments)   Redness, small rash    Sulfa Antibiotics Itching      Medication List    STOP taking these medications   isopropyl alcohol 70 % external solution     TAKE these medications   acetaminophen 325 MG  tablet Commonly known as:  Tylenol Take 2 tablets (650 mg total) by mouth every 4 (four) hours as needed for moderate pain (TAKE TOGETHER WITH IBUPROFEN FOR MODERATE TO SEVERE TOOTH PAIN).   bismuth subsalicylate 262 MG/15ML suspension Commonly known as:  PEPTO BISMOL Take 30 mLs by mouth every 6 (six) hours as needed for indigestion.   calcium carbonate 500 MG chewable tablet Commonly known as:  TUMS - dosed in mg elemental calcium Chew 2-3 tablets by mouth daily as needed. For indigestion   clindamycin 300 MG capsule Commonly known as:  CLEOCIN Take 1 capsule (300 mg total) by mouth 4 (four) times daily for 10 days. Start oral antibiotic this evening TODAY   ibuprofen 200 MG tablet Commonly known as:  ADVIL,MOTRIN Take  800 mg by mouth every 6 (six) hours as needed for mild pain or moderate pain.   lisinopril 5 MG tablet Commonly known as:  PRINIVIL,ZESTRIL Take 1 tablet (5 mg total) by mouth daily. Start taking on:  April 27, 2018   Melatonin 5 MG Tabs Take 15 mg by mouth at bedtime.   predniSONE 10 MG tablet Commonly known as:  DELTASONE Take 4 tablets (40 mg total) by mouth daily for 2 days. Start tomorrow April 27, 2018      Allergies  Allergen Reactions  . Adhesive [Tape] Other (See Comments)    Redness, small rash   . Sulfa Antibiotics Itching      The results of significant diagnostics from this hospitalization (including imaging, microbiology, ancillary and laboratory) are listed below for reference.    Significant Diagnostic Studies: Ct Maxillofacial W Contrast  Result Date: 04/24/2018 CLINICAL DATA:  LEFT dental pain and swelling since this morning. History of Graves disease. EXAM: CT MAXILLOFACIAL WITH CONTRAST TECHNIQUE: Multidetector CT imaging of the maxillofacial structures was performed with intravenous contrast. Multiplanar CT image reconstructions were also generated. CONTRAST:  31mL OMNIPAQUE IOHEXOL 300 MG/ML  SOLN COMPARISON:  None. FINDINGS:  OSSEOUS: No acute facial fracture. The mandible is intact, the condyles are located. Scattered dental caries and periapical lucencies/abscess including tooth 20 associated with the subperiosteal abscess. ORBITS: Ocular globes and orbital contents are normal. SINUSES: Paranasal sinuses are well aerated. Intact nasal septum is midline. Mastoid aircells are well aerated. SOFT TISSUES: 2 x 11 x 16 mm (transverse by cc by [facility]) LEFT mandible body gingival subperiosteal reaction. LEFT lower facial soft tissue swelling with thickened platysma and subcutaneous fat stranding. No subcutaneous gas or radiopaque foreign bodies. 10 mm short access hyperemic LEFT level 2A lymph node. LIMITED INTRACRANIAL: Normal. IMPRESSION: 1. 2 x 11 x 16 mm LEFT mandible body subperiosteal odontogenic abscess associated with poor dentition. 2. LEFT facial cellulitis and mild reactive cervical lymphadenopathy. Electronically Signed   By: Awilda Metro M.D.   On: 04/24/2018 22:02    Microbiology: No results found for this or any previous visit (from the past 240 hour(s)).   Labs: Basic Metabolic Panel: Recent Labs  Lab 04/24/18 2043 04/25/18 0529 04/26/18 0618  NA 135 138 135  K 3.8 3.8 3.6  CL 102 106 104  CO2 24 26 21*  GLUCOSE 122* 111* 150*  BUN 8 7 10   CREATININE 0.91 1.00 0.76  CALCIUM 9.2 8.8* 8.4*   Liver Function Tests: Recent Labs  Lab 04/25/18 0529  AST 16  ALT 17  ALKPHOS 83  BILITOT 0.9  PROT 7.4  ALBUMIN 4.2   No results for input(s): LIPASE, AMYLASE in the last 168 hours. No results for input(s): AMMONIA in the last 168 hours. CBC: Recent Labs  Lab 04/24/18 2043 04/25/18 0529 04/26/18 0618  WBC 19.3* 15.9* 21.8*  NEUTROABS 13.7*  --  19.1*  HGB 14.7 14.9 13.5  HCT 46.1* 45.4 40.9  MCV 91.1 93.0 91.9  PLT 363 332 327   Cardiac Enzymes: No results for input(s): CKTOTAL, CKMB, CKMBINDEX, TROPONINI in the last 168 hours. BNP: BNP (last 3 results) No results for input(s): BNP in the  last 8760 hours.  ProBNP (last 3 results) No results for input(s): PROBNP in the last 8760 hours.  CBG: No results for input(s): GLUCAP in the last 168 hours.     Signed:  Myrtie Neither, MD [facility] 04/26/2018, 12:08 PM

## 2018-08-21 ENCOUNTER — Emergency Department (HOSPITAL_COMMUNITY)
Admission: EM | Admit: 2018-08-21 | Discharge: 2018-08-21 | Disposition: A | Discharge status: Home / Self Care | Payer: 59 | Attending: Emergency Medicine | Admitting: Emergency Medicine

## 2018-08-21 ENCOUNTER — Other Ambulatory Visit: Payer: Self-pay

## 2018-08-21 ENCOUNTER — Encounter (HOSPITAL_COMMUNITY): Payer: Self-pay | Admitting: Emergency Medicine

## 2018-08-21 DIAGNOSIS — R197 Diarrhea, unspecified: Secondary | ICD-10-CM | POA: Insufficient documentation

## 2018-08-21 DIAGNOSIS — F17210 Nicotine dependence, cigarettes, uncomplicated: Secondary | ICD-10-CM | POA: Diagnosis not present

## 2018-08-21 DIAGNOSIS — R1084 Generalized abdominal pain: Secondary | ICD-10-CM | POA: Diagnosis not present

## 2018-08-21 DIAGNOSIS — Z79899 Other long term (current) drug therapy: Secondary | ICD-10-CM | POA: Diagnosis not present

## 2018-08-21 DIAGNOSIS — R112 Nausea with vomiting, unspecified: Secondary | ICD-10-CM | POA: Insufficient documentation

## 2018-08-21 DIAGNOSIS — I10 Essential (primary) hypertension: Secondary | ICD-10-CM | POA: Diagnosis not present

## 2018-08-21 DIAGNOSIS — J45909 Unspecified asthma, uncomplicated: Secondary | ICD-10-CM | POA: Diagnosis not present

## 2018-08-21 LAB — CBC
HCT: 47.1 % — ABNORMAL HIGH (ref 36.0–46.0)
Hemoglobin: 15.3 g/dL — ABNORMAL HIGH (ref 12.0–15.0)
MCH: 29.9 pg (ref 26.0–34.0)
MCHC: 32.5 g/dL (ref 30.0–36.0)
MCV: 92 fL (ref 80.0–100.0)
Platelets: 400 10*3/uL (ref 150–400)
RBC: 5.12 MIL/uL — ABNORMAL HIGH (ref 3.87–5.11)
RDW: 13.2 % (ref 11.5–15.5)
WBC: 14.6 10*3/uL — ABNORMAL HIGH (ref 4.0–10.5)
nRBC: 0 % (ref 0.0–0.2)

## 2018-08-21 LAB — URINALYSIS, ROUTINE W REFLEX MICROSCOPIC
Bacteria, UA: NONE SEEN
Bilirubin Urine: NEGATIVE
Glucose, UA: NEGATIVE mg/dL
Ketones, ur: 20 mg/dL — AB
Leukocytes,Ua: NEGATIVE
Nitrite: NEGATIVE
Protein, ur: NEGATIVE mg/dL
Specific Gravity, Urine: 1.026 (ref 1.005–1.030)
pH: 6 (ref 5.0–8.0)

## 2018-08-21 LAB — COMPREHENSIVE METABOLIC PANEL
ALT: 25 U/L (ref 0–44)
AST: 24 U/L (ref 15–41)
Albumin: 4.9 g/dL (ref 3.5–5.0)
Alkaline Phosphatase: 86 U/L (ref 38–126)
Anion gap: 14 (ref 5–15)
BUN: 14 mg/dL (ref 6–20)
CO2: 21 mmol/L — ABNORMAL LOW (ref 22–32)
Calcium: 9.8 mg/dL (ref 8.9–10.3)
Chloride: 104 mmol/L (ref 98–111)
Creatinine, Ser: 0.89 mg/dL (ref 0.44–1.00)
GFR calc Af Amer: >60 mL/min (ref >60)
GFR calc non Af Amer: >60 mL/min (ref >60)
Glucose, Bld: 156 mg/dL — ABNORMAL HIGH (ref 70–99)
Potassium: 3.8 mmol/L (ref 3.5–5.1)
Sodium: 139 mmol/L (ref 135–145)
Total Bilirubin: 0.8 mg/dL (ref 0.3–1.2)
Total Protein: 8.3 g/dL — ABNORMAL HIGH (ref 6.5–8.1)

## 2018-08-21 LAB — LIPASE, BLOOD: Lipase: 27 U/L (ref 11–51)

## 2018-08-21 MED ORDER — METOCLOPRAMIDE HCL 5 MG/ML IJ SOLN
10.0000 mg | Freq: Once | INTRAMUSCULAR | Status: AC
  Administered 2018-08-21: 10 mg via INTRAVENOUS
  Filled 2018-08-21: qty 2

## 2018-08-21 MED ORDER — SODIUM CHLORIDE 0.9% FLUSH: 3.0000 mL | Freq: Once | INTRAVENOUS | Status: DC

## 2018-08-21 MED ORDER — SODIUM CHLORIDE 0.9 % IV BOLUS
1000.0000 mL | Freq: Once | INTRAVENOUS | Status: AC
  Administered 2018-08-21: 1000 mL via INTRAVENOUS

## 2018-08-21 MED ORDER — MORPHINE SULFATE (PF) 4 MG/ML IV SOLN
4.0000 mg | Freq: Once | INTRAVENOUS | Status: AC
  Administered 2018-08-21: 4 mg via INTRAVENOUS
  Filled 2018-08-21: qty 1

## 2018-08-21 MED ORDER — OXYCODONE-ACETAMINOPHEN 5-325 MG PO TABS: 1.0000 | ORAL_TABLET | Freq: Four times a day (QID) | ORAL | 0 refills | Status: DC | PRN

## 2018-08-21 MED ORDER — SODIUM CHLORIDE 0.9 % IV BOLUS
500.0000 mL | Freq: Once | INTRAVENOUS | Status: AC
  Administered 2018-08-21: 500 mL via INTRAVENOUS

## 2018-08-21 MED ORDER — METOCLOPRAMIDE HCL 10 MG PO TABS: 10.0000 mg | ORAL_TABLET | Freq: Four times a day (QID) | ORAL | 0 refills | Status: DC

## 2018-08-21 MED ORDER — CIPROFLOXACIN HCL 500 MG PO TABS: 500.0000 mg | ORAL_TABLET | Freq: Two times a day (BID) | ORAL | 0 refills | Status: DC

## 2018-08-21 MED ORDER — METRONIDAZOLE 500 MG PO TABS: 500.0000 mg | ORAL_TABLET | Freq: Three times a day (TID) | ORAL | 0 refills | Status: DC

## 2018-08-21 MED ORDER — DIPHENHYDRAMINE HCL 50 MG/ML IJ SOLN
12.5000 mg | Freq: Once | INTRAMUSCULAR | Status: AC
  Administered 2018-08-21: 19:00:00 12.5 mg via INTRAVENOUS
  Filled 2018-08-21: qty 1

## 2018-08-21 MED ORDER — ONDANSETRON 4 MG PO TBDP
4.0000 mg | ORAL_TABLET | Freq: Once | ORAL | Status: AC | PRN
  Administered 2018-08-21: 4 mg via ORAL
  Filled 2018-08-21: qty 1

## 2018-08-21 MED ORDER — FAMOTIDINE IN NACL 20-0.9 MG/50ML-% IV SOLN
20.0000 mg | Freq: Once | INTRAVENOUS | Status: AC
  Administered 2018-08-21: 20 mg via INTRAVENOUS
  Filled 2018-08-21: qty 50

## 2018-08-21 NOTE — ED Triage Notes (Addendum)
Patient complaining of abdominal pain, nausea, vomiting, and diarrhea starting at 0400 this morning.

## 2018-08-21 NOTE — ED Notes (Addendum)
Pt was sleeping, stated she is having no pain or nausea at this time. Pt reminded that we need a urine sample, she stated she can't right now. Then turned over and went back to sleep.

## 2018-08-21 NOTE — ED Notes (Signed)
Pt aware urine specimen is needed. Will notify staff when one can be obtained.

## 2018-08-21 NOTE — ED Provider Notes (Signed)
[facility] Provider Note   CSN: [id number] Arrival date & time: 08/21/18  1548     History   Chief Complaint Chief Complaint  Patient presents with  . Abdominal Pain    HPI Debra Barnett is a [age] y.o. female with a hx of anemia, anxiety, asthma, diverticulosis, colitis, GERD, HTN, & grave's disease who presents to the ED with complaints of N/V/D & abdominal pain that begat @ 0400 this AM. Patient states she has had TNTC episodes of non bloody emesis & 5-6 episodes of non bloody diarrhea.  Reports generalized abdominal pain that is described as sharp, crampy, aching, 8/10 in severity, worse with vomiting. Reports chills, no measured fevers. She tried phenergan suppository this AM w/o relief. Zofarn in traige w/ minimal improvement. Sxs feel the same as prior diverticulitis which has improved w/ cipro/flagyl, has not previously had perf/abscess or required surgical intervention for her diverticulitis. Denies fever, hematemesis, melena, hematochezia, dysuria, vaginal bleeding/discharge, or recent travel/abx/hospitalizations. Patient states she is post menopausal.     HPI  Past Medical History:  Diagnosis Date  . Allergy    food  . Anemia   . Anxiety   . Arthritis    hips, rotator cuff, hands  . Asthma   . Carpal tunnel syndrome of right wrist   . Colitis Nov 2013  . Depression   . Diverticulosis   . GERD (gastroesophageal reflux disease)   . Hernia, hiatal   . Hypertension   . Thyroid disease     Patient Active Problem List   Diagnosis Date Noted  . Hypertension 04/26/2018  . Facial cellulitis 04/24/2018  . Graves' disease 10/29/2016  . Lactose intolerance 10/17/2016  . Hyperthyroidism 10/16/2016  . Diverticulosis 09/11/2016  . GAD (generalized anxiety disorder) 09/11/2016  . Chronic neck pain 09/11/2016  . Tobacco abuse 09/11/2016  . GERD (gastroesophageal reflux disease) 03/27/2012  . Chronic nausea 03/27/2012    Past Surgical History:   Procedure Laterality Date  . COLONOSCOPY WITH ESOPHAGOGASTRODUODENOSCOPY (EGD) N/A 04/11/2012   Procedure: COLONOSCOPY WITH ESOPHAGOGASTRODUODENOSCOPY (EGD);  Surgeon: West BaliSandi L Fields, MD;  Location: [facility]ITE;  Service: Endoscopy;  Laterality: N/A;  8:30  . WISDOM TOOTH EXTRACTION  1994     OB History   No obstetric history on file.      Home Medications    Prior to Admission medications   Medication Sig Start Date End Date Taking? Authorizing Provider  acetaminophen (TYLENOL) 325 MG tablet Take 2 tablets (650 mg total) by mouth every 4 (four) hours as needed for moderate pain (TAKE TOGETHER WITH IBUPROFEN FOR MODERATE TO SEVERE TOOTH PAIN). 04/26/18 04/26/19  Myrtie NeitherUgah, Nwannadiya, MD  bismuth subsalicylate (PEPTO BISMOL) 262 MG/15ML suspension Take 30 mLs by mouth every 6 (six) hours as needed for indigestion.    [provider]  calcium carbonate (TUMS - DOSED IN MG ELEMENTAL CALCIUM) 500 MG chewable tablet Chew 2-3 tablets by mouth daily as needed. For indigestion    [provider]  ibuprofen (ADVIL,MOTRIN) 200 MG tablet Take 800 mg by mouth every 6 (six) hours as needed for mild pain or moderate pain.    [provider]  lisinopril (PRINIVIL,ZESTRIL) 5 MG tablet Take 1 tablet (5 mg total) by mouth daily. 04/27/18   Myrtie NeitherUgah, Nwannadiya, MD  Melatonin 5 MG TABS Take 15 mg by mouth at bedtime.    [provider]    Family History Family History  Problem Relation Age of Onset  . Thyroid disease  Mother        hypo  . Depression Mother   . Stroke Father   . Diabetes Father   . Alcohol abuse Father   . Depression Father   . Drug abuse Father   . Hypertension Father   . Early death Father [id number]       diabetes/stroke  . COPD Maternal Grandmother   . Alcohol abuse Maternal Grandfather   . COPD Maternal Grandfather   . Colon cancer Neg Hx   . Colon polyps Neg Hx     Social History Social History   Tobacco Use  . Smoking status: Current Every Day  Smoker    Packs/day: 1.00    Years: 22.00    Pack years: 22.00    Types: Cigarettes    Start date: 02/20/1988  . Smokeless tobacco: Never Used  Substance Use Topics  . Alcohol use: Yes    Comment: occasionally 3 times per year, HX heavy etoh x 2 yrs (20's)  . Drug use: Yes    Types: Marijuana    Comment: pt reports using a friend's pain med at times for severe pain-Past marijuana last couple weeks     Allergies   Adhesive [tape] and Sulfa antibiotics   Review of Systems Review of Systems  Constitutional: Positive for chills. Negative for fever.  Respiratory: Negative for shortness of breath.   Cardiovascular: Negative for chest pain.  Gastrointestinal: Positive for abdominal pain, diarrhea, nausea and vomiting. Negative for anal bleeding, blood in stool and constipation.  Genitourinary: Negative for dysuria, vaginal bleeding and vaginal discharge.  All other systems reviewed and are negative.    Physical Exam Updated Vital Signs BP (!) 173/106 (BP Location: Left Arm)   Pulse 80   Temp 97.8 F (36.6 C) (Oral)   Resp 20   Ht 5\' 6"  (1.676 m)   Wt 65.8 kg   SpO2 100%   BMI 23.40 kg/m   Physical Exam Vitals signs and nursing note reviewed.  Constitutional:      Appearance: She is well-developed. She is not toxic-appearing.     Comments: tearful  HENT:     Head: Normocephalic and atraumatic.  Eyes:     General:        Right eye: No discharge.        Left eye: No discharge.     Conjunctiva/sclera: Conjunctivae normal.  Neck:     Musculoskeletal: Neck supple.  Cardiovascular:     Rate and Rhythm: Normal rate and regular rhythm.  Pulmonary:     Effort: Pulmonary effort is normal. No respiratory distress.     Breath sounds: Normal breath sounds. No wheezing, rhonchi or rales.  Abdominal:     General: There is no distension.     Palpations: Abdomen is soft.     Tenderness: There is generalized abdominal tenderness. There is no guarding or rebound.  Skin:     General: Skin is warm and dry.     Findings: No rash.  Neurological:     Mental Status: She is alert.     Comments: Clear speech.   Psychiatric:        Behavior: Behavior normal.    ED Treatments / Results  Labs (all labs ordered are listed, but only abnormal results are displayed) Labs Reviewed  COMPREHENSIVE METABOLIC PANEL - Abnormal; Notable for the following components:      Result Value   CO2 21 (*)    Glucose, Bld 156 (*)    Total  Protein 8.3 (*)    All other components within normal limits  CBC - Abnormal; Notable for the following components:   WBC 14.6 (*)    RBC 5.12 (*)    Hemoglobin 15.3 (*)    HCT 47.1 (*)    All other components within normal limits  URINALYSIS, ROUTINE W REFLEX MICROSCOPIC - Abnormal; Notable for the following components:   Hgb urine dipstick SMALL (*)    Ketones, ur 20 (*)    All other components within normal limits  LIPASE, BLOOD    EKG None  Radiology No results found.  Procedures Procedures (including critical care time)  Medications Ordered in ED Medications  sodium chloride flush (NS) 0.9 % injection 3 mL (0 mLs Intravenous Hold 08/21/18 1843)  ondansetron (ZOFRAN-ODT) disintegrating tablet 4 mg (4 mg Oral Given 08/21/18 1605)  sodium chloride 0.9 % bolus 1,000 mL (0 mLs Intravenous Stopped 08/21/18 1942)  metoCLOPramide (REGLAN) injection 10 mg (10 mg Intravenous Given 08/21/18 1842)  diphenhydrAMINE (BENADRYL) injection 12.5 mg (12.5 mg Intravenous Given 08/21/18 1842)  morphine 4 MG/ML injection 4 mg (4 mg Intravenous Given 08/21/18 1842)  famotidine (PEPCID) IVPB 20 mg premix (0 mg Intravenous Stopped 08/21/18 1942)  sodium chloride 0.9 % bolus 500 mL (500 mLs Intravenous New Bag/Given 08/21/18 2156)     Initial Impression / Assessment and Plan / ED Course  I have reviewed the triage vital signs and the nursing notes.  Pertinent labs & imaging results that were available during my care of the patient were reviewed by me and  considered in my medical decision making (see chart for details).    Patient presents to the ED with complaints of abdominal pain w/ N/V/D. Patient nontoxic appearing, in no apparent distress, vitals w/ elevated BP- low suspicion for HTN emergency. On exam patient tender to generalized abdomen, no peritoneal signs. Will evaluate with labs. Analgesics, anti-emetics, and fluids administered.   ER work-up reviewed:  CBC: Leukocytosis @ 14.6. No anemia, hgb/hct elevated.  CMP: Mild hyperglycemia @ 156. Mild acidosis @ 21. No electrolyte derangement. LFTs & renal function WNL.  Lipase: WNL UA: No UTI, small hgb & 20 ketones- PCP recheck  Patient states sxs feel similar to prior diverticulitis, her presentation is not classic for this, however we discussed option of CT imaging for further assessment vs. tx for presumed diverticulitis w/ analgesics, anti-emetics, & abx- risks/benefits discussed, she states this feels the same and would like to avoid CT & tx as discussed. She states cipro/flagyl have worked well for her in the past. Without peritoneal signs feel this is reasonable- doubt perf, obstruction,or abscess. No hematemesis/melena complaints or labs to raise concern for significant acute GI bleed. Plan for symptomatic control & reassessment.   20:00: RE-EVAL: Patient woken from sleep, feeling much better, pressures have been soft but per RN each record in 19E systolic has been while patient has been sleeping, will trial PO challenge.   Patient tolerating PO in the emergency department. BP normalized. Will discharge home with supportive measures (reglan/percocet-[place] Controlled Substance reporting System queried) & abx (cipro/flagyl- discussed no EtOH w/ flagyl). I discussed results, treatment plan, need for PCP follow-up, and return precautions with the patient. Provided opportunity for questions, patient confirmed understanding and is in agreement with plan.   Findings and plan of care  discussed with supervising physician Dr. Eulis Foster who is in agreement.   Vitals:   08/21/18 2030 08/21/18 2045 08/21/18 2100 08/21/18 2200  BP: (!) 90/57  Marland Kitchen)  95/56 127/68  Pulse: 68 66 64   Resp:      Temp:      TempSrc:      SpO2: 94% 94% 95%   Weight:      Height:       Final Clinical Impressions(s) / ED Diagnoses   Final diagnoses:  Generalized abdominal pain    ED Discharge Orders         Ordered    ciprofloxacin (CIPRO) 500 MG tablet  2 times daily     08/21/18 2227    metroNIDAZOLE (FLAGYL) 500 MG tablet  3 times daily     08/21/18 2227    metoCLOPramide (REGLAN) 10 MG tablet  Every 6 hours     08/21/18 2227    oxyCODONE-acetaminophen (PERCOCET/ROXICET) 5-325 MG tablet  Every 6 hours PRN     08/21/18 2227           Cherly Andersonetrucelli, Jemmie Rhinehart R, PA-C 08/21/18 2229    Mancel BaleWentz, Elliott, MD 08/21/18 2326

## 2018-08-21 NOTE — Discharge Instructions (Addendum)
You were seen in the emergency department today for abdominal pain with vomiting and diarrhea.  Your work-up in the ER was overall reassuring.  Blood work showed that your white blood cell is up which can happen with infection.  You also had some ketones and blood in your urine, please have this rechecked by primary care.  We are treating you for presumed diverticulitis with Cipro and Flagyl which are antibiotics, please take these as prescribed.  Do not consume alcohol with Flagyl as it can be extremely dangerous.  We are also sending you home with Reglan to take every 6 hours as needed for nausea and vomiting as well as Percocet to take every 6 hours as needed for severe pain.Percocet is a narcotic/controlled substance medication that has potential addicting qualities.  We recommend that you take 1-2 tablets every 6 hours as needed for severe pain.  Do not drive or operate heavy machinery when taking this medicine as it can be sedating. Do not drink alcohol or take other sedating medications when taking this medicine for safety reasons.  Keep this out of reach of small children.  Please be aware this medicine has Tylenol in it (325 mg/tab) do not exceed the maximum dose of Tylenol in a day per over the counter recommendations should you decide to supplement with Tylenol over the counter.   We have prescribed you new medication(s) today. Discuss the medications prescribed today with your pharmacist as they can have adverse effects and interactions with your other medicines including over the counter and prescribed medications. Seek medical evaluation if you start to experience new or abnormal symptoms after taking one of these medicines, seek care immediately if you start to experience difficulty breathing, feeling of your throat closing, facial swelling, or rash as these could be indications of a more serious allergic reaction  Please follow-up closely with primary care, if you do not have a primary care  provider please see information below.  Please follow-up within 3 days.  Return to the ER for new or worsening symptoms including but not limited to fever, worsening pain, localized pain, vomiting blood, blood in your stool, inability to keep fluids down, or any other concerns.  [facility]    Kari BaarsEdward Hawkins MD. Specialty: Pulmonary Disease Contact information: [address]  [address]  [place] [address]  [phone]   Syliva OvermanMargaret Simpson, MD. Specialty: [facility] Contact information: [address]  [place] [address]  [phone]   Lilyan PuntScott Luking, MD. Specialty: [facility] Contact information: [address]  [address]  [place] [address]  [phone]   Avon Gullyesfaye Fanta, MD Specialty: Internal Medicine Contact information: [address]  [place] [address]  [phone]   Catalina PizzaZach Hall, MD. Specialty: Internal Medicine Contact information: [address]  [place] [address]  [phone]    [facility] (Dr. Selena BattenKim) Specialty: Family Medicine Contact information: [address]  [place] [address]  [phone]   John GiovanniStephen Knowlton, MD. Specialty: [facility] Contact information: [address]  [address]  [place] [address]  [phone]   Carylon Perchesoy Fagan, MD. Specialty: Internal Medicine Contact information: [address]  [address]  [place] [address]  [phone]    [facility]  [address] [place] [address] [phone]  Services [facility] offers a variety of basic health services.  Services include but are not limited to: Blood pressure checks  Heart rate checks  Blood sugar checks  Urine analysis  Rapid strep tests  Pregnancy tests.  Health education and referrals  People needing more complex services will be directed to a physician online. Using these virtual visits, doctors  can evaluate and prescribe medicine and treatments. There will be no medication on-site, though [facility] will help patients fill their prescriptions at little to no cost.   For More information please go to: [email or web address]

## 2019-05-07 ENCOUNTER — Other Ambulatory Visit: Payer: Self-pay

## 2019-05-07 ENCOUNTER — Emergency Department (HOSPITAL_COMMUNITY): Payer: 59

## 2019-05-07 ENCOUNTER — Encounter (HOSPITAL_COMMUNITY): Payer: Self-pay | Admitting: Emergency Medicine

## 2019-05-07 ENCOUNTER — Emergency Department (HOSPITAL_COMMUNITY)
Admission: EM | Admit: 2019-05-07 | Discharge: 2019-05-07 | Disposition: A | Discharge status: Home / Self Care | Payer: 59 | Attending: Emergency Medicine | Admitting: Emergency Medicine

## 2019-05-07 DIAGNOSIS — F17210 Nicotine dependence, cigarettes, uncomplicated: Secondary | ICD-10-CM | POA: Insufficient documentation

## 2019-05-07 DIAGNOSIS — R109 Unspecified abdominal pain: Secondary | ICD-10-CM

## 2019-05-07 DIAGNOSIS — J45909 Unspecified asthma, uncomplicated: Secondary | ICD-10-CM | POA: Diagnosis not present

## 2019-05-07 DIAGNOSIS — Z79899 Other long term (current) drug therapy: Secondary | ICD-10-CM | POA: Insufficient documentation

## 2019-05-07 DIAGNOSIS — I10 Essential (primary) hypertension: Secondary | ICD-10-CM | POA: Insufficient documentation

## 2019-05-07 DIAGNOSIS — R1030 Lower abdominal pain, unspecified: Secondary | ICD-10-CM | POA: Insufficient documentation

## 2019-05-07 LAB — COMPREHENSIVE METABOLIC PANEL
ALT: 26 U/L (ref 0–44)
AST: 28 U/L (ref 15–41)
Albumin: 5.1 g/dL — ABNORMAL HIGH (ref 3.5–5.0)
Alkaline Phosphatase: 77 U/L (ref 38–126)
Anion gap: 15 (ref 5–15)
BUN: 13 mg/dL (ref 6–20)
CO2: 26 mmol/L (ref 22–32)
Calcium: 10.8 mg/dL — ABNORMAL HIGH (ref 8.9–10.3)
Chloride: 98 mmol/L (ref 98–111)
Creatinine, Ser: 1.09 mg/dL — ABNORMAL HIGH (ref 0.44–1.00)
GFR calc Af Amer: >60 mL/min (ref >60)
GFR calc non Af Amer: 60 mL/min — ABNORMAL LOW (ref >60)
Glucose, Bld: 181 mg/dL — ABNORMAL HIGH (ref 70–99)
Potassium: 3.5 mmol/L (ref 3.5–5.1)
Sodium: 139 mmol/L (ref 135–145)
Total Bilirubin: 0.6 mg/dL (ref 0.3–1.2)
Total Protein: 8.3 g/dL — ABNORMAL HIGH (ref 6.5–8.1)

## 2019-05-07 LAB — CBC WITH DIFFERENTIAL/PLATELET
Abs Immature Granulocytes: 0.1 10*3/uL — ABNORMAL HIGH (ref 0.00–0.07)
Basophils Absolute: 0.1 10*3/uL (ref 0.0–0.1)
Basophils Relative: 0 %
Eosinophils Absolute: 0 10*3/uL (ref 0.0–0.5)
Eosinophils Relative: 0 %
HCT: 45.9 % (ref 36.0–46.0)
Hemoglobin: 15.1 g/dL — ABNORMAL HIGH (ref 12.0–15.0)
Immature Granulocytes: 1 %
Lymphocytes Relative: 11 %
Lymphs Abs: 2 10*3/uL (ref 0.7–4.0)
MCH: 30.3 pg (ref 26.0–34.0)
MCHC: 32.9 g/dL (ref 30.0–36.0)
MCV: 92 fL (ref 80.0–100.0)
Monocytes Absolute: 0.6 10*3/uL (ref 0.1–1.0)
Monocytes Relative: 3 %
Neutro Abs: 15.8 10*3/uL — ABNORMAL HIGH (ref 1.7–7.7)
Neutrophils Relative %: 85 %
Platelets: 407 10*3/uL — ABNORMAL HIGH (ref 150–400)
RBC: 4.99 MIL/uL (ref 3.87–5.11)
RDW: 14.4 % (ref 11.5–15.5)
WBC: 18.5 10*3/uL — ABNORMAL HIGH (ref 4.0–10.5)
nRBC: 0 % (ref 0.0–0.2)

## 2019-05-07 LAB — URINALYSIS, ROUTINE W REFLEX MICROSCOPIC
Bilirubin Urine: NEGATIVE
Glucose, UA: NEGATIVE mg/dL
Hgb urine dipstick: NEGATIVE
Ketones, ur: NEGATIVE mg/dL
Leukocytes,Ua: NEGATIVE
Nitrite: NEGATIVE
Protein, ur: NEGATIVE mg/dL
Specific Gravity, Urine: >1.046 — ABNORMAL HIGH (ref 1.005–1.030)
pH: 6 (ref 5.0–8.0)

## 2019-05-07 LAB — LIPASE, BLOOD: Lipase: 26 U/L (ref 11–51)

## 2019-05-07 MED ORDER — FAMOTIDINE IN NACL 20-0.9 MG/50ML-% IV SOLN
20.0000 mg | Freq: Once | INTRAVENOUS | Status: AC
  Administered 2019-05-07: 20 mg via INTRAVENOUS
  Filled 2019-05-07: qty 50

## 2019-05-07 MED ORDER — SODIUM CHLORIDE 0.9 % IV BOLUS
1000.0000 mL | Freq: Once | INTRAVENOUS | Status: AC
  Administered 2019-05-07: 1000 mL via INTRAVENOUS

## 2019-05-07 MED ORDER — ONDANSETRON HCL 4 MG/2ML IJ SOLN
4.0000 mg | Freq: Once | INTRAMUSCULAR | Status: AC
  Administered 2019-05-07: 4 mg via INTRAVENOUS
  Filled 2019-05-07: qty 2

## 2019-05-07 MED ORDER — LORAZEPAM 2 MG/ML IJ SOLN
1.0000 mg | Freq: Once | INTRAMUSCULAR | Status: AC
  Administered 2019-05-07: 1 mg via INTRAVENOUS
  Filled 2019-05-07: qty 1

## 2019-05-07 MED ORDER — HYDROMORPHONE HCL 1 MG/ML IJ SOLN
1.0000 mg | Freq: Once | INTRAMUSCULAR | Status: AC
  Administered 2019-05-07: 1 mg via INTRAVENOUS
  Filled 2019-05-07: qty 1

## 2019-05-07 MED ORDER — IOHEXOL 300 MG/ML  SOLN
100.0000 mL | Freq: Once | INTRAMUSCULAR | Status: AC | PRN
  Administered 2019-05-07: 100 mL via INTRAVENOUS

## 2019-05-07 NOTE — ED Notes (Signed)
Pt drinking water with no difficulties. Pt informed we still need a urine sample. Pt continues to state that she can't at this time.   Pt attempted to give urine sample earlier after 1L of NS bolus was given but was unable to give enough of a sample to perform urinalysis.

## 2019-05-07 NOTE — ED Provider Notes (Signed)
[facility] EMERGENCY DEPARTMENT Provider Note   CSN: [id number] Arrival date & time: 05/07/19  0932     History Chief Complaint  Patient presents with  . Abdominal Pain    Debra Barnett is a 49 y.o. female with history of gastritis and diverticulitis who presents with abdominal pain. The abdominal pain started acutely this morning. She can't tell if it's from diverticulitis vs gastritis. The pain started in the upper abdomen and is now in the lower abdomen. She reports associated N/V/D. She denies urinary symptoms. She takes a PPI. No prior abdominal surgeries. History is limited due to acuity as patient is yelling and rocking in bed  HPI     Past Medical History:  Diagnosis Date  . Allergy    food  . Anemia   . Anxiety   . Arthritis    hips, rotator cuff, hands  . Asthma   . Carpal tunnel syndrome of right wrist   . Colitis Nov 2013  . Depression   . Diverticulosis   . GERD (gastroesophageal reflux disease)   . Hernia, hiatal   . Hypertension   . Thyroid disease     Patient Active Problem List   Diagnosis Date Noted  . Hypertension 04/26/2018  . Facial cellulitis 04/24/2018  . Graves' disease 10/29/2016  . Lactose intolerance 10/17/2016  . Hyperthyroidism 10/16/2016  . Diverticulosis 09/11/2016  . GAD (generalized anxiety disorder) 09/11/2016  . Chronic neck pain 09/11/2016  . Tobacco abuse 09/11/2016  . GERD (gastroesophageal reflux disease) 03/27/2012  . Chronic nausea 03/27/2012    Past Surgical History:  Procedure Laterality Date  . COLONOSCOPY WITH ESOPHAGOGASTRODUODENOSCOPY (EGD) N/A 04/11/2012   Procedure: COLONOSCOPY WITH ESOPHAGOGASTRODUODENOSCOPY (EGD);  Surgeon: West Bali, MD;  [facility];  Service: Endoscopy;  Laterality: N/A;  8:30  . WISDOM TOOTH EXTRACTION  1994     OB History    Gravida      Para      Term      Preterm      AB      Living  2     SAB      TAB      Ectopic      Multiple      Live  Births              Family History  Problem Relation Age of Onset  . Thyroid disease Mother        hypo  . Depression Mother   . Stroke Father   . Diabetes Father   . Alcohol abuse Father   . Depression Father   . Drug abuse Father   . Hypertension Father   . Early death Father 85       diabetes/stroke  . COPD Maternal Grandmother   . Alcohol abuse Maternal Grandfather   . COPD Maternal Grandfather   . Colon cancer Neg Hx   . Colon polyps Neg Hx     Social History   Tobacco Use  . Smoking status: Current Every Day Smoker    Packs/day: 1.00    Years: 22.00    Pack years: 22.00    Types: Cigarettes    Start date: 02/20/1988  . Smokeless tobacco: Never Used  Substance Use Topics  . Alcohol use: Yes    Comment: occasionally 3 times per year, HX heavy etoh x 2 yrs (20's)  . Drug use: Yes    Types: Marijuana    Comment: pt reports using  a friend's pain med at times for severe pain-Past marijuana last couple weeks    Home Medications Prior to Admission medications   Medication Sig Start Date End Date Taking? Authorizing Provider  bismuth subsalicylate (PEPTO BISMOL) 262 MG/15ML suspension Take 30 mLs by mouth every 6 (six) hours as needed for indigestion.    [provider]  calcium carbonate (TUMS - DOSED IN MG ELEMENTAL CALCIUM) 500 MG chewable tablet Chew 2-3 tablets by mouth daily as needed. For indigestion    [provider]  ciprofloxacin (CIPRO) 500 MG tablet Take 1 tablet (500 mg total) by mouth 2 (two) times daily. 08/21/18   Petrucelli, Pleas Koch, PA-C  Melatonin 5 MG TABS Take 15 mg by mouth at bedtime.    [provider]  metoCLOPramide (REGLAN) 10 MG tablet Take 1 tablet (10 mg total) by mouth every 6 (six) hours. 08/21/18   Petrucelli, Samantha R, PA-C  metroNIDAZOLE (FLAGYL) 500 MG tablet Take 1 tablet (500 mg total) by mouth 3 (three) times daily. 08/21/18   Petrucelli, Samantha R, PA-C  oxyCODONE-acetaminophen (PERCOCET/ROXICET)  5-325 MG tablet Take 1 tablet by mouth every 6 (six) hours as needed for severe pain. 08/21/18   Petrucelli, Pleas Koch, PA-C  promethazine (PHENERGAN) 25 MG suppository Place 25 mg rectally once.    [provider]  lisinopril (PRINIVIL,ZESTRIL) 5 MG tablet Take 1 tablet (5 mg total) by mouth daily. Patient not taking: Reported on 08/21/2018 04/27/18 08/21/18  Myrtie Neither, MD    Allergies    Adhesive [tape] and Sulfa antibiotics  Review of Systems   Review of Systems  Constitutional: Negative for chills and fever.  Respiratory: Negative for shortness of breath.   Cardiovascular: Negative for chest pain.  Gastrointestinal: Positive for abdominal pain, diarrhea, nausea and vomiting.  Genitourinary: Negative for dysuria.  All other systems reviewed and are negative.   Physical Exam Updated Vital Signs Pulse 72   Resp (!) 22   Ht 5\' 5"  (1.651 m)   Wt 68 kg   SpO2 98%   BMI 24.96 kg/m   Physical Exam Vitals and nursing note reviewed.  Constitutional:      General: She is in acute distress (yelling and rocking in bed).     Appearance: She is well-developed.  HENT:     Head: Normocephalic and atraumatic.  Eyes:     General: No scleral icterus.       Right eye: No discharge.        Left eye: No discharge.     Conjunctiva/sclera: Conjunctivae normal.     Pupils: Pupils are equal, round, and reactive to light.  Cardiovascular:     Rate and Rhythm: Normal rate and regular rhythm.  Pulmonary:     Effort: Pulmonary effort is normal. No respiratory distress.     Breath sounds: Normal breath sounds.  Abdominal:     General: Abdomen is flat. Bowel sounds are normal. There is no distension.     Palpations: Abdomen is soft.     Tenderness: There is abdominal tenderness in the right lower quadrant and suprapubic area.  Musculoskeletal:     Cervical back: Normal range of motion.  Skin:    General: Skin is warm and dry.  Neurological:     Mental Status: She is alert and  oriented to person, place, and time.  Psychiatric:        Mood and Affect: Mood is anxious.        Behavior: Behavior normal.  ED Results / Procedures / Treatments   Labs (all labs ordered are listed, but only abnormal results are displayed) Labs Reviewed  COMPREHENSIVE METABOLIC PANEL - Abnormal; Notable for the following components:      Result Value   Glucose, Bld 181 (*)    Creatinine, Ser 1.09 (*)    Calcium 10.8 (*)    Total Protein 8.3 (*)    Albumin 5.1 (*)    GFR calc non Af Amer 60 (*)    All other components within normal limits  CBC WITH DIFFERENTIAL/PLATELET - Abnormal; Notable for the following components:   WBC 18.5 (*)    Hemoglobin 15.1 (*)    Platelets 407 (*)    Neutro Abs 15.8 (*)    Abs Immature Granulocytes 0.10 (*)    All other components within normal limits  LIPASE, BLOOD  URINALYSIS, ROUTINE W REFLEX MICROSCOPIC    EKG None  Radiology CT Abdomen Pelvis W Contrast  Result Date: 05/07/2019 CLINICAL DATA:  49 year old female with history of nausea and vomiting in the lower abdominal pain starting this morning. EXAM: CT ABDOMEN AND PELVIS WITH CONTRAST TECHNIQUE: Multidetector CT imaging of the abdomen and pelvis was performed using the standard protocol following bolus administration of intravenous contrast. CONTRAST:  166mL OMNIPAQUE IOHEXOL 300 MG/ML  SOLN COMPARISON:  CT the abdomen and pelvis 11/14/2016. FINDINGS: Lower chest: Areas of scarring and/or atelectasis in the dependent portions of the lower lobes of the lungs bilaterally. Small hiatal hernia. Hepatobiliary: No suspicious cystic or solid hepatic lesions. No intra or extrahepatic biliary ductal dilatation. Gallbladder is normal in appearance. Pancreas: No pancreatic mass. No pancreatic ductal dilatation. No pancreatic or peripancreatic fluid collections or inflammatory changes. Spleen: Unremarkable. Adrenals/Urinary Tract: Subcentimeter low-attenuation lesion in the interpolar region of the  right kidney, too small to characterize, but statistically likely to represent a tiny cyst. Left kidney and bilateral adrenal glands are normal in appearance. No hydroureteronephrosis. Urinary bladder is normal in appearance. Stomach/Bowel: Normal appearance of the stomach. No pathologic dilatation of small bowel or colon. Colon is completely decompressed. Normal appendix. Vascular/Lymphatic: Aortic atherosclerosis, without evidence of aneurysm or dissection in the abdominal or pelvic vasculature. No lymphadenopathy noted in the abdomen or pelvis. Reproductive: Uterus and ovaries are unremarkable in appearance. Other: No significant volume of ascites.  No pneumoperitoneum. Musculoskeletal: There are no aggressive appearing lytic or blastic lesions noted in the visualized portions of the skeleton. IMPRESSION: 1. No acute findings are noted in the abdomen or pelvis to account for the patient's symptoms. 2. Normal appendix. Electronically Signed   By: Vinnie Langton M.D.   On: 05/07/2019 13:03    Procedures Procedures (including critical care time)  Medications Ordered in ED Medications  HYDROmorphone (DILAUDID) injection 1 mg (1 mg Intravenous Given 05/07/19 1000)  ondansetron (ZOFRAN) injection 4 mg (4 mg Intravenous Given 05/07/19 1000)  famotidine (PEPCID) IVPB 20 mg premix (0 mg Intravenous Stopped 05/07/19 1032)  LORazepam (ATIVAN) injection 1 mg (1 mg Intravenous Given 05/07/19 1009)  sodium chloride 0.9 % bolus 1,000 mL (0 mLs Intravenous Stopped 05/07/19 1200)  iohexol (OMNIPAQUE) 300 MG/ML solution 100 mL (100 mLs Intravenous Contrast Given 05/07/19 1138)    ED Course  I have reviewed the triage vital signs and the nursing notes.  Pertinent labs & imaging results that were available during my care of the patient were reviewed by me and considered in my medical decision making (see chart for details).  49 year old female presents with acute abdominal  pain with N/V/D. History and exam are  limited due to patient being distressed. Will provide pain control and reassess. Will order labs, UA, CT abdomen/pelvis.  CBC shows leukocytosis (18). This appears to be chronic. CMP shows hyperglycemia (181), mild AKI (SCr 1.09).   10:31 AM After meds she is much more comfortable. She is sleepy however and sats were in the 80s so she was put on 2L via [place].  2:52 PM Rechecked pt again. She still is comfortable. CT is negative. Encouraged her to give a UA.  UA is normal. Will treat as gastritis. She is already on Omeprazole. Advised avoid aggravating foods, ETOH, alcohol.   MDM Rules/Calculators/A&P                       Final Clinical Impression(s) / ED Diagnoses Final diagnoses:  Abdominal pain, unspecified abdominal location    Rx / DC Orders ED Discharge Orders    None       Bethel Born, PA-C 05/07/19 1609    Donnetta Hutching, MD 05/11/19 [phone]

## 2019-05-07 NOTE — ED Notes (Addendum)
Pt sleeping, but responsive to verbal stimuli. Pt denies pain when she was aroused.

## 2019-05-07 NOTE — Discharge Instructions (Addendum)
Please continue Omeprazole for gastritis Avoid NSAIDs such as Ibuprofen, Motrin, Advil, Aleve, Naproxen Please return if you are worsening

## 2019-05-07 NOTE — ED Triage Notes (Signed)
PT c/o lower abdominal pain and into her right lower back at times with nausea and vomiting that started this am. PT states hx of diverticulitis and pain feels similar. PT c/o diaphoresis and is hollering out and crying upon arrival.

## 2019-05-07 NOTE — ED Notes (Signed)
Went into patient's room to advise we needed urine specimen.  Patient is sleeping and snoring, very hard to wake up.

## 2021-03-06 ENCOUNTER — Encounter: Payer: Self-pay | Admitting: Emergency Medicine

## 2021-03-06 ENCOUNTER — Ambulatory Visit
Admission: EM | Admit: 2021-03-06 | Discharge: 2021-03-06 | Disposition: A | Discharge status: Home / Self Care | Payer: 59 | Attending: Urgent Care | Admitting: Urgent Care

## 2021-03-06 ENCOUNTER — Other Ambulatory Visit: Payer: Self-pay

## 2021-03-06 DIAGNOSIS — H11421 Conjunctival edema, right eye: Secondary | ICD-10-CM

## 2021-03-06 DIAGNOSIS — L03213 Periorbital cellulitis: Secondary | ICD-10-CM | POA: Diagnosis not present

## 2021-03-06 MED ORDER — TOBRAMYCIN 0.3 % OP SOLN: 1.0000 [drp] | OPHTHALMIC | 0 refills | Status: AC

## 2021-03-06 MED ORDER — CEFDINIR 300 MG PO CAPS: 300.0000 mg | ORAL_CAPSULE | Freq: Two times a day (BID) | ORAL | 0 refills | Status: AC

## 2021-03-06 NOTE — ED Provider Notes (Addendum)
[facility]   MRN: [id number] DOB: [birth year]  Subjective:   Debra Barnett is a 51 y.o. female presenting for 4-day history of acute onset persistent and worsening right sided eyelid pain, swelling, redness now having swelling of the right upper eyelid.  She has had intermittent blurry vision mostly when he gets watery.  No history of glaucoma.  Patient states that symptoms started after she was using her eyeliner pen and accidentally scratched her right eye.  She has tried to keep nursing it but has gotten worse.  No current facility-administered medications for this encounter.  Current Outpatient Medications:    bismuth subsalicylate (PEPTO BISMOL) 262 MG/15ML suspension, Take 30 mLs by mouth every 6 (six) hours as needed for indigestion., Disp: , Rfl:    calcium carbonate (TUMS - DOSED IN MG ELEMENTAL CALCIUM) 500 MG chewable tablet, Chew 2-3 tablets by mouth daily as needed. For indigestion, Disp: , Rfl:    Multiple Vitamins-Minerals (MULTIVITAMIN WITH MINERALS) tablet, Take 1 tablet by mouth daily., Disp: , Rfl:    Melatonin 5 MG TABS, Take 15 mg by mouth at bedtime., Disp: , Rfl:    Allergies  Allergen Reactions   Adhesive [Tape] Other (See Comments)    Redness, small rash    Sulfa Antibiotics Itching    Past Medical History:  Diagnosis Date   Allergy    food   Anemia    Anxiety    Arthritis    hips, rotator cuff, hands   Asthma    Carpal tunnel syndrome of right wrist    Colitis Nov 2013   Depression    Diverticulosis    GERD (gastroesophageal reflux disease)    Hernia, hiatal    Hypertension    Thyroid disease      Past Surgical History:  Procedure Laterality Date   COLONOSCOPY WITH ESOPHAGOGASTRODUODENOSCOPY (EGD) N/A 04/11/2012   Procedure: COLONOSCOPY WITH ESOPHAGOGASTRODUODENOSCOPY (EGD);  Surgeon: Danie Binder, MD;  [facility];  Service: Endoscopy;  Laterality: N/A;  8:30   WISDOM TOOTH EXTRACTION  1994    Family History   Problem Relation Age of Onset   Thyroid disease Mother        hypo   Depression Mother    Stroke Father    Diabetes Father    Alcohol abuse Father    Depression Father    Drug abuse Father    Hypertension Father    Early death Father 70       diabetes/stroke   COPD Maternal Grandmother    Alcohol abuse Maternal Grandfather    COPD Maternal Grandfather    Colon cancer Neg Hx    Colon polyps Neg Hx     Social History   Tobacco Use   Smoking status: Every Day    Packs/day: 1.00    Years: 22.00    Pack years: 22.00    Types: Cigarettes    Start date: 02/20/1988   Smokeless tobacco: Never  Vaping Use   Vaping Use: Never used  Substance Use Topics   Alcohol use: Yes    Comment: occasionally 3 times per year, HX heavy etoh x 2 yrs (20's)   Drug use: Yes    Types: Marijuana    Comment: pt reports using a friend's pain med at times for severe pain-Past marijuana last couple weeks    ROS   Objective:   Vitals: BP (!) 176/111 (BP Location: Right Arm)    Pulse 77    Temp 98.7  F (37.1 C) (Oral)    Resp 18    Ht 5\' 5"  (1.651 m)    Wt 120 lb (54.4 kg)    SpO2 97%    BMI 19.97 kg/m   Physical Exam Constitutional:      General: She is not in acute distress.    Appearance: Normal appearance. She is well-developed. She is not ill-appearing, toxic-appearing or diaphoretic.  HENT:     Head: Normocephalic and atraumatic.     Nose: Nose normal.     Mouth/Throat:     Mouth: Mucous membranes are moist.  Eyes:     General: Lids are everted, no foreign bodies appreciated. No scleral icterus.       Right eye: Discharge present. No foreign body or hordeolum.        Left eye: No foreign body, discharge or hordeolum.     Extraocular Movements: Extraocular movements intact.     Right eye: Normal extraocular motion and no nystagmus.     Left eye: Normal extraocular motion and no nystagmus.     Conjunctiva/sclera:     Right eye: Right conjunctiva is injected. Chemosis present. No  exudate or hemorrhage.    Left eye: Left conjunctiva is not injected. No chemosis, exudate or hemorrhage. Cardiovascular:     Rate and Rhythm: Normal rate.  Pulmonary:     Effort: Pulmonary effort is normal.  Skin:    General: Skin is warm and dry.  Neurological:     General: No focal deficit present.     Mental Status: She is alert and oriented to person, place, and time.  Psychiatric:        Mood and Affect: Mood normal.        Behavior: Behavior normal.   Eye Exam: Eyelids everted and swept for foreign body. The eye was anesthetized with 2 drops of tetracaine and stained with fluorescein. Examination under woods lamp does not reveal a foreign body or area of increased stain uptake. The eye was then irrigated copiously with saline.  Assessment and Plan :   PDMP not reviewed this encounter.  1. Preseptal cellulitis of left upper eyelid   2. Chemosis of conjunctiva, right    Recommended double coverage for preseptal cellulitis and possible corneal scratch.  None was observed using Woods lamp.  Recommended cefdinir, tobramycin.  Given her ecchymosis, swelling and need for rule out of glaucoma we will also redirect her to Dr. Midge Aver. Counseled patient on potential for adverse effects with medications prescribed/recommended today, ER and return-to-clinic precautions discussed, patient verbalized understanding.     Jaynee Eagles, PA-C 03/06/21 1248

## 2021-03-06 NOTE — ED Triage Notes (Signed)
Pt reports scratched right eye on Thursday and reports didn't really have a problem until yesterday. Pt reports has used otc drops, eye patch with no relief of symptoms. Pt reports top eyelid on right eye is even more swollen today.

## 2022-03-09 ENCOUNTER — Encounter: Payer: Self-pay | Admitting: *Deleted
